# Patient Record
Sex: Male | Born: 1955 | Race: White | Hispanic: No | Marital: Married | Smoking: Never smoker
Health system: Southern US, Community
[De-identification: ages and names within clinical notes are randomized; demographics above are authoritative.]

## PROBLEM LIST (undated history)

## (undated) DIAGNOSIS — I10 Essential (primary) hypertension: Secondary | ICD-10-CM

## (undated) DIAGNOSIS — M1990 Unspecified osteoarthritis, unspecified site: Secondary | ICD-10-CM

## (undated) DIAGNOSIS — Z87828 Personal history of other (healed) physical injury and trauma: Secondary | ICD-10-CM

## (undated) HISTORY — DX: Personal history of other (healed) physical injury and trauma: Z87.828

## (undated) HISTORY — PX: JOINT REPLACEMENT: SHX530

## (undated) HISTORY — DX: Unspecified osteoarthritis, unspecified site: M19.90

## (undated) HISTORY — PX: BACK SURGERY: SHX140

## (undated) HISTORY — DX: Essential (primary) hypertension: I10

## (undated) HISTORY — PX: CATARACT EXTRACTION: SUR2

## (undated) HISTORY — PX: KNEE SURGERY: SHX244

## (undated) HISTORY — PX: HERNIA REPAIR: SHX51

---

## 1997-07-02 ENCOUNTER — Emergency Department (HOSPITAL_COMMUNITY): Admission: EM | Admit: 1997-07-02 | Discharge: 1997-07-02 | Payer: Self-pay | Admitting: Emergency Medicine

## 1998-01-04 ENCOUNTER — Encounter: Payer: Self-pay | Admitting: Emergency Medicine

## 1998-01-04 ENCOUNTER — Emergency Department (HOSPITAL_COMMUNITY): Admission: EM | Admit: 1998-01-04 | Discharge: 1998-01-04 | Payer: Self-pay | Admitting: Emergency Medicine

## 2003-02-07 HISTORY — PX: CARDIAC CATHETERIZATION: SHX172

## 2003-08-03 ENCOUNTER — Other Ambulatory Visit: Payer: Self-pay

## 2004-01-26 ENCOUNTER — Encounter: Payer: Self-pay | Admitting: Unknown Physician Specialty

## 2005-03-21 ENCOUNTER — Ambulatory Visit: Payer: Self-pay | Admitting: Unknown Physician Specialty

## 2006-01-23 ENCOUNTER — Emergency Department: Payer: Self-pay | Admitting: Emergency Medicine

## 2006-04-30 ENCOUNTER — Emergency Department: Payer: Self-pay | Admitting: Emergency Medicine

## 2006-05-09 ENCOUNTER — Inpatient Hospital Stay: Payer: Self-pay | Admitting: Internal Medicine

## 2006-05-10 ENCOUNTER — Inpatient Hospital Stay (HOSPITAL_COMMUNITY): Admission: AD | Admit: 2006-05-10 | Discharge: 2006-05-11 | Payer: Self-pay | Admitting: Cardiovascular Disease

## 2006-09-15 ENCOUNTER — Emergency Department: Payer: Self-pay | Admitting: Emergency Medicine

## 2006-09-17 ENCOUNTER — Inpatient Hospital Stay: Payer: Self-pay | Admitting: Internal Medicine

## 2007-02-04 ENCOUNTER — Emergency Department: Payer: Self-pay | Admitting: Emergency Medicine

## 2007-12-04 ENCOUNTER — Emergency Department: Payer: Self-pay | Admitting: Emergency Medicine

## 2010-01-25 ENCOUNTER — Emergency Department: Payer: Self-pay | Admitting: Unknown Physician Specialty

## 2010-03-29 ENCOUNTER — Emergency Department: Payer: Self-pay | Admitting: Emergency Medicine

## 2014-06-07 ENCOUNTER — Ambulatory Visit
Admission: EM | Admit: 2014-06-07 | Discharge: 2014-06-07 | Disposition: A | Discharge status: Home / Self Care | Payer: BLUE CROSS/BLUE SHIELD | Attending: Internal Medicine | Admitting: Internal Medicine

## 2014-06-07 DIAGNOSIS — R12 Heartburn: Secondary | ICD-10-CM

## 2014-06-07 DIAGNOSIS — R1010 Upper abdominal pain, unspecified: Secondary | ICD-10-CM

## 2014-06-07 DIAGNOSIS — K5900 Constipation, unspecified: Secondary | ICD-10-CM

## 2014-06-07 MED ORDER — OMEPRAZOLE 20 MG PO CPDR: DELAYED_RELEASE_CAPSULE | ORAL | Status: DC

## 2014-06-07 NOTE — ED Provider Notes (Signed)
CSN: [id number]     Arrival date & time 06/07/14  1133 History   First MD Initiated Contact with Patient 06/07/14 1336     Chief Complaint  Patient presents with  . Abdominal Pain   (Consider location/radiation/quality/duration/timing/severity/associated sxs/prior Treatment) Patient is a 59 y.o. male presenting with abdominal pain. The history is provided by the patient and the spouse.  Abdominal Pain Pain location:  LUQ, RUQ, epigastric and periumbilical Pain quality: aching, bloating and cramping   Pain radiates to:  Does not radiate Pain severity:  Moderate Onset quality:  Sudden Timing:  Intermittent Progression:  Unchanged Chronicity:  New Context: diet changes and laxative use   Context: not alcohol use, not awakening from sleep, not eating, not medication withdrawal, not previous surgeries, not recent illness, not recent sexual activity, not recent travel, not retching, not sick contacts, not suspicious food intake and not trauma   Relieved by:  Flatus and bowel activity Worsened by:  Nothing tried Ineffective treatments:  Antacids, bowel activity, flatus, eating, movement, palpation, not moving, position changes and urination Associated symptoms: chills, constipation, diarrhea and fatigue   Associated symptoms: no fever, no nausea and no vomiting    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  Started zantac  po OTC taking up to TID but not working. Noted chills, sweating 2 days ago, headache, umbilical/epigastric/sternal pain along with constipation-no BM x 1 week.  Tried diet modification with prunes no BM still so talked with pharmacist and got magnesium citrate took entire bottle yesterday because no BM 4 hours after 1/2 bottle consumption.  Bowels broke  loose, green diarrhea and brown chunks for next 6 hours.  Feeling better now but concerned still having occasional diarrhea, heartburn, belly pain, headaches, sweats and chills.  Two weeks ago had diarrhea/stomach virus exposed to neighbors with similar symptoms prior to onset and resolved after 6 hours.  Recently saw [facility] Apr 2016 for routine PE but was not having GI symptoms at that time.  No past medical history on file. Past Surgical History  Procedure Laterality Date  . Hernia repair    . Joint replacement     Family History  Problem Relation Age of Onset  . Cancer Mother   . Heart failure Father    History  Substance Use Topics  . Smoking status: Never Smoker   . Smokeless tobacco: Never Used  . Alcohol Use: 0.6 oz/week    1 Cans of beer per week    Review of Systems  Constitutional: Positive for chills, diaphoresis and fatigue. Negative for fever, activity change, appetite change and unexpected weight change.  HENT: Negative.   Eyes: Negative.   Respiratory: Negative.   Cardiovascular: Negative.   Gastrointestinal: Positive for abdominal pain, diarrhea and constipation. Negative for nausea, vomiting, blood in stool, abdominal distention, anal bleeding and rectal pain.  Genitourinary: Negative.   Skin: Negative.   Allergic/Immunologic: Negative.   Neurological: Negative.   Hematological: Negative.   Psychiatric/Behavioral: Negative.     Allergies  Codeine  Home Medications   Prior to Admission medications   Medication Sig Start Date End Date Taking? Authorizing Provider  acetaminophen (TYLENOL) 500 MG tablet Take 500 mg by mouth every 8 (eight) hours as needed.   Yes Historical Provider, MD  ranitidine (ZANTAC) 150 MG capsule Take 150 mg by mouth  2 (two) times daily.   Yes Historical Provider, MD  traMADol (ULTRAM) 50 MG tablet Take 50 mg by mouth every 6 (six) hours as needed.   Yes Historical Provider, MD  omeprazole (PRILOSEC) 20 MG capsule Take 1 tabs po BID  06/07/14   Jarold Songina A Darlen Gledhill, NP   BP 118/79 mmHg  Pulse 89  Temp(Src) 97 F (36.1 C) (Tympanic)  Ht 6' (1.829 m)  Wt 230 lb (104.327 kg)  BMI 31.19 kg/m2  SpO2 98% Physical Exam  Constitutional: He appears well-developed and well-nourished. No distress.  HENT:  Head: Normocephalic and atraumatic.  Right Ear: Hearing, tympanic membrane, external ear and ear canal normal.  Left Ear: Hearing, tympanic membrane, external ear and ear canal normal.  Nose: Nose normal.  Mouth/Throat: Uvula is midline and mucous membranes are normal. Posterior oropharyngeal edema and posterior oropharyngeal erythema present. No oropharyngeal exudate or tonsillar abscesses.  Cobblestoning posterior pharynx  Eyes: Conjunctivae and EOM are normal. Pupils are equal, round, and reactive to light.  Abdominal: Soft. Normal appearance, normal aorta and bowel sounds are normal. He exhibits no distension, no fluid wave, no ascites and no mass. There is no hepatosplenomegaly. There is tenderness in the right upper quadrant, epigastric area and left upper quadrant. There is no rigidity, no rebound, no guarding, no CVA tenderness, no tenderness at McBurney's point and negative Murphy's sign. No hernia. Hernia confirmed negative in the ventral area.  Skin: He is not diaphoretic.    ED Course  Procedures (including critical care time) Labs Review Labs Reviewed - No data to display Discussed with patient and spouse:  High-fiber diet Eat more high-fiber foods, which will help prevent constipation.  The best sources of fiber are whole-grain cereals, such as shredded wheat or cereals with bran.  Fresh fruit and raw or cooked vegetables, especially asparagus, cabbage, carrots, corn, and broccoli are other good sources of fiber.   . Fluids  Drink plenty of water.  This helps to soften bowel movements so they are easier to pass.   Exercise.  May require fiber supplement discussed miralax 1 capful po daily.  Increase fruits/fibers  and whole grains in diet to get 25gm fiber in diet over course of next month.  Attempt diet modification.  Patient given [employer] handout on constipation/dietary fiber.  Patient agreed with plan of care verbalized understanding of information/instructions and had no further questions at this time. P2:  increase fruits/fiber/whole grains and fluid intake I have recommended clear fluids and advanced to soft as tolerated  Avoid dairy, spicy, large portions meat, concentrated sugars and fried foods until diarrhea resolves.  Patient given exitcare handout on diarrhea.  Medications as directed.   Return to the clinic if  symptoms persist or worsen; I have alerted the patient to call if high fever, dehydration, marked weakness, fainting, increased abdominal pain, blood in stool or vomit.  Patient given work/school excuse note for 48 hours.  Patient and spouse verbalized agreement and understanding of treatment plan.   P2:  Hand washing and fitness The pathophysiology of reflux is discussed.  Exit[employer] handout on esophageal reflux given to patient. Anti-reflux measures such as raising the head of the bed, avoiding tight clothing or belts, avoiding eating late at night and not lying down shortly after mealtime and achieving weight loss were discussed. Avoid ASA, NSAID's, caffeine, peppermints, alcohol and tobacco. OTC tums/antacids are often very helpful for PRN use.  However, for chronic or daily symptoms, prescription strength H2 blockers or a trial of  PPI's should be used.  H2 blocker not effective will stop and start prilosec  po BID and patient to follow up with PCM in 2 weeks.   Patient should alert me or PCM if there are persistent symptoms, dysphagia, weight loss or GI bleeding (bright red or black). Follow up with clinic provider or PCM if symptoms persist despite therapy. Patient and spouse verbalized agreement and understanding of treatment plan and had no further questions at this time.    P2:  Diet,  Food avoidance that aggravate condition, and Fitness  Imaging Review No results found.   MDM   1. Constipation, unspecified constipation type   2. Heartburn   3. Pain localized to upper abdomen        Barbaraann Barthel, NP 06/07/14 1928

## 2014-06-07 NOTE — ED Notes (Signed)
Patient with stomach virus a few weeks ago. That passed then had constipation and abd pain. Used OTC laxative and that helped. Still feeling warn out and discomfort. Has history of heartburn, umbilical hernia and stomach ulcers

## 2014-06-07 NOTE — Discharge Instructions (Signed)
Abdominal Pain °Many things can cause abdominal pain. Usually, abdominal pain is not caused by a disease and will improve without treatment. It can often be observed and treated at home. Your health care provider will do a physical exam and possibly order blood tests and X-rays to help determine the seriousness of your pain. However, in many cases, more time must pass before a clear cause of the pain can be found. Before that point, your health care provider may not know if you need more testing or further treatment. °HOME CARE INSTRUCTIONS  °Monitor your abdominal pain for any changes. The following actions may help to alleviate any discomfort you are experiencing: °· Only take over-the-counter or prescription medicines as directed by your health care provider. °· Do not take laxatives unless directed to do so by your health care provider. °· Try a clear liquid diet (broth, tea, or water) as directed by your health care provider. Slowly move to a bland diet as tolerated. °SEEK MEDICAL CARE IF: °· You have unexplained abdominal pain. °· You have abdominal pain associated with nausea or diarrhea. °· You have pain when you urinate or have a bowel movement. °· You experience abdominal pain that wakes you in the night. °· You have abdominal pain that is worsened or improved by eating food. °· You have abdominal pain that is worsened with eating fatty foods. °· You have a fever. °SEEK IMMEDIATE MEDICAL CARE IF:  °· Your pain does not go away within 2 hours. °· You keep throwing up (vomiting). °· Your pain is felt only in portions of the abdomen, such as the right side or the left lower portion of the abdomen. °· You pass bloody or black tarry stools. °MAKE SURE YOU: °· Understand these instructions.   °· Will watch your condition.   °· Will get help right away if you are not doing well or get worse.   °Document Released: 11/02/2004 Document Revised: 01/28/2013 Document Reviewed: 10/02/2012 °ExitCare® Patient Information  ©2015 [employer]. This information is not intended to replace advice given to you by your health care provider. Make sure you discuss any questions you have with your health care provider. ° °Gastritis, Adult °Gastritis is soreness and swelling (inflammation) of the lining of the stomach. Gastritis can develop as a sudden onset (acute) or long-term (chronic) condition. If gastritis is not treated, it can lead to stomach bleeding and ulcers. °CAUSES  °Gastritis occurs when the stomach lining is weak or damaged. Digestive juices from the stomach then inflame the weakened stomach lining. The stomach lining may be weak or damaged due to viral or bacterial infections. One common bacterial infection is the Helicobacter pylori infection. Gastritis can also result from excessive alcohol consumption, taking certain medicines, or having too much acid in the stomach.  °SYMPTOMS  °In some cases, there are no symptoms. When symptoms are present, they may include: °· Pain or a burning sensation in the upper abdomen. °· Nausea. °· Vomiting. °· An uncomfortable feeling of fullness after eating. °DIAGNOSIS  °Your caregiver may suspect you have gastritis based on your symptoms and a physical exam. To determine the cause of your gastritis, your caregiver may perform the following: °· Blood or stool tests to check for the H pylori bacterium. °· Gastroscopy. A thin, flexible tube (endoscope) is passed down the esophagus and into the stomach. The endoscope has a light and camera on the end. Your caregiver uses the endoscope to view the inside of the stomach. °· Taking a tissue sample (biopsy)   from the stomach to examine under a microscope. °TREATMENT  °Depending on the cause of your gastritis, medicines may be prescribed. If you have a bacterial infection, such as an H pylori infection, antibiotics may be given. If your gastritis is caused by too much acid in the stomach, H2 blockers or antacids may be given. Your caregiver may  recommend that you stop taking aspirin, ibuprofen, or other nonsteroidal anti-inflammatory drugs (NSAIDs). °HOME CARE INSTRUCTIONS °· Only take over-the-counter or prescription medicines as directed by your caregiver. °· If you were given antibiotic medicines, take them as directed. Finish them even if you start to feel better. °· Drink enough fluids to keep your urine clear or pale yellow. °· Avoid foods and drinks that make your symptoms worse, such as: °¨ Caffeine or alcoholic drinks. °¨ Chocolate. °¨ Peppermint or mint flavorings. °¨ Garlic and onions. °¨ Spicy foods. °¨ Citrus fruits, such as oranges, lemons, or limes. °¨ Tomato-based foods such as sauce, chili, salsa, and pizza. °¨ Fried and fatty foods. °· Eat small, frequent meals instead of large meals. °SEEK IMMEDIATE MEDICAL CARE IF:  °· You have black or dark red stools. °· You vomit blood or material that looks like coffee grounds. °· You are unable to keep fluids down. °· Your abdominal pain gets worse. °· You have a fever. °· You do not feel better after 1 week. °· You have any other questions or concerns. °MAKE SURE YOU: °· Understand these instructions. °· Will watch your condition. °· Will get help right away if you are not doing well or get worse. °Document Released: 01/17/2001 Document Revised: 07/25/2011 Document Reviewed: 03/08/2011 °ExitCare® Patient Information ©2015 [employer]. This information is not intended to replace advice given to you by your health care provider. Make sure you discuss any questions you have with your health care provider. ° °

## 2014-08-06 ENCOUNTER — Ambulatory Visit: Payer: Self-pay | Admitting: Family Medicine

## 2014-09-17 ENCOUNTER — Ambulatory Visit (INDEPENDENT_AMBULATORY_CARE_PROVIDER_SITE_OTHER): Payer: 59 | Admitting: Family Medicine

## 2014-09-17 ENCOUNTER — Encounter: Payer: Self-pay | Admitting: Family Medicine

## 2014-09-17 VITALS — BP 125/79 | HR 93 | Temp 97.8°F | Ht 70.6 in | Wt 239.0 lb

## 2014-09-17 DIAGNOSIS — E7800 Pure hypercholesterolemia, unspecified: Secondary | ICD-10-CM

## 2014-09-17 DIAGNOSIS — R972 Elevated prostate specific antigen [PSA]: Secondary | ICD-10-CM | POA: Diagnosis not present

## 2014-09-17 DIAGNOSIS — M545 Low back pain, unspecified: Secondary | ICD-10-CM | POA: Insufficient documentation

## 2014-09-17 DIAGNOSIS — M25561 Pain in right knee: Secondary | ICD-10-CM | POA: Diagnosis not present

## 2014-09-17 DIAGNOSIS — K219 Gastro-esophageal reflux disease without esophagitis: Secondary | ICD-10-CM | POA: Diagnosis not present

## 2014-09-17 DIAGNOSIS — Z0000 Encounter for general adult medical examination without abnormal findings: Secondary | ICD-10-CM | POA: Diagnosis not present

## 2014-09-17 DIAGNOSIS — G8929 Other chronic pain: Secondary | ICD-10-CM

## 2014-09-17 LAB — MICROSCOPIC EXAMINATION

## 2014-09-17 LAB — URINALYSIS, ROUTINE W REFLEX MICROSCOPIC
Bilirubin, UA: NEGATIVE
Glucose, UA: NEGATIVE
Ketones, UA: NEGATIVE
Nitrite, UA: NEGATIVE
Protein, UA: NEGATIVE
RBC, UA: NEGATIVE
SPEC GRAV UA: 1.02 (ref 1.005–1.030)
Urobilinogen, Ur: 0.2 mg/dL (ref 0.2–1.0)
pH, UA: 5 (ref 5.0–7.5)

## 2014-09-17 MED ORDER — OMEPRAZOLE 20 MG PO CPDR: DELAYED_RELEASE_CAPSULE | ORAL | Status: DC

## 2014-09-17 MED ORDER — TRAMADOL HCL 50 MG PO TABS: 50.0000 mg | ORAL_TABLET | Freq: Four times a day (QID) | ORAL | Status: DC | PRN

## 2014-09-17 NOTE — Assessment & Plan Note (Signed)
Stable with occ tramadol use S/p 4 surgerys

## 2014-09-17 NOTE — Assessment & Plan Note (Signed)
The current medical regimen is effective;  continue present plan and medications.  

## 2014-09-17 NOTE — Progress Notes (Addendum)
BP 125/79 mmHg  Pulse 93  Temp(Src) 97.8 F (36.6 C)  Ht 5' 10.6" (1.793 m)  Wt 239 lb (108.41 kg)  BMI 33.72 kg/m2  SpO2 97%   Subjective:    Patient ID: Peter [name], male    DOB: 29-Jan-1956, 59 y.o.   MRN: [id number]  HPI: Peter [name] is a 59 y.o. male  Chief Complaint  Patient presents with  . Annual Exam   patient otherwise doing well and had some dental surgery which is recovering from doing well has been placed on omeprazole 20 mg twice a day which is really resolved his reflux and chest pain symptoms. Patient continues to use tramadol on a when necessary basis. This is a result of trauma of falling through a roof in 1979 resulting back surgery. Patient's also had 4 surgeries on his right knee is result of this injury. 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   Relevant past medical, surgical, family and social history reviewed and updated as indicated. Interim medical history since our last visit reviewed. Allergies and medications reviewed and updated.  Review of Systems  Constitutional: Negative.   HENT: Negative.   Eyes: Negative.   Respiratory: Negative.   Cardiovascular: Negative.   Endocrine: Negative.   Musculoskeletal: Negative.   Skin: Negative.   Allergic/Immunologic: Negative.   Neurological: Negative.   Hematological: Negative.   Psychiatric/Behavioral: Negative.     Per HPI unless specifically indicated above     Objective:    BP 125/79 mmHg  Pulse 93  Temp(Src) 97.8 F (36.6 C)  Ht 5' 10.6" (1.793 m)  Wt 239 lb (108.41 kg)  BMI 33.72 kg/m2  SpO2 97%  Wt Readings from Last 3 Encounters:  09/17/14 239 lb (108.41 kg)  06/01/14 236 lb (107.049 kg)  06/07/14 230 lb (104.327 kg)    Physical Exam  Constitutional: He is oriented to person, place, and time. He appears well-developed and well-nourished.  HENT:  Head: Normocephalic and atraumatic.  Right Ear: External ear  normal.  Left Ear: External ear normal.  Eyes: Conjunctivae and EOM are normal. Pupils are equal, round, and reactive to light.  Neck: Normal range of motion. Neck supple.  Cardiovascular: Normal rate, regular rhythm, normal heart sounds and intact distal pulses.   Pulmonary/Chest: Effort normal and breath sounds normal.  Abdominal: Soft. Bowel sounds are normal. There is no splenomegaly or hepatomegaly.  Genitourinary: Rectum normal, prostate normal and penis normal.  Musculoskeletal: Normal range of motion.  Neurological: He is alert and oriented to person, place, and time. He has normal reflexes.  Skin: No rash noted. No erythema.  Psychiatric: He has a normal mood and affect. His behavior is normal. Judgment and thought content normal.    No results found for this or any previous visit.    Assessment & Plan:   Problem List Items Addressed This Visit      Digestive   Acid reflux    The current medical regimen is effective;  continue present plan and medications.       Relevant Medications   omeprazole (PRILOSEC) 20 MG capsule     Other   Chronic low back pain    Stable on occ tramadol use S/p mult surgery      Relevant Medications   traMADol (ULTRAM) 50 MG tablet   Chronic pain of right knee    Stable with occ tramadol use S/p 4 surgerys  Other Visit Diagnoses    Routine general medical examination at a health care facility    -  Primary    Relevant Orders    CBC with Differential/Platelet    Comprehensive metabolic panel    PSA    Lipid Panel w/o Chol/HDL Ratio    TSH    Urinalysis, Routine w reflex microscopic (not at [facility])    PE (physical exam), annual          discussed diet exercise wt loss  Follow up plan: Return in about 1 year (around 09/17/2015) for Physical Exam.      Phone call Discussed elevated lipids markedly so need to do better with diet and exercise nutrition Will recheck fasting lipid panel 2 months. Patient's PSA also  elevated Will recheck PSA 2 months

## 2014-09-17 NOTE — Assessment & Plan Note (Signed)
Stable on occ tramadol use S/p mult surgery

## 2014-09-18 LAB — CBC WITH DIFFERENTIAL/PLATELET
Basophils Absolute: 0 10*3/uL (ref 0.0–0.2)
Basos: 1 %
EOS (ABSOLUTE): 0.5 10*3/uL — ABNORMAL HIGH (ref 0.0–0.4)
Eos: 8 %
Hematocrit: 43.2 % (ref 37.5–51.0)
Hemoglobin: 14.6 g/dL (ref 12.6–17.7)
Immature Grans (Abs): 0 10*3/uL (ref 0.0–0.1)
Immature Granulocytes: 0 %
Lymphocytes Absolute: 1.5 10*3/uL (ref 0.7–3.1)
Lymphs: 22 %
MCH: 30.8 pg (ref 26.6–33.0)
MCHC: 33.8 g/dL (ref 31.5–35.7)
MCV: 91 fL (ref 79–97)
Monocytes Absolute: 0.4 10*3/uL (ref 0.1–0.9)
Monocytes: 7 %
Neutrophils Absolute: 4.2 10*3/uL (ref 1.4–7.0)
Neutrophils: 62 %
Platelets: 362 10*3/uL (ref 150–379)
RBC: 4.74 x10E6/uL (ref 4.14–5.80)
RDW: 14.6 % (ref 12.3–15.4)
WBC: 6.6 10*3/uL (ref 3.4–10.8)

## 2014-09-18 LAB — LIPID PANEL W/O CHOL/HDL RATIO
Cholesterol, Total: 279 mg/dL — ABNORMAL HIGH (ref 100–199)
HDL: 37 mg/dL — ABNORMAL LOW (ref >39)
LDL Calculated: 165 mg/dL — ABNORMAL HIGH (ref 0–99)
Triglycerides: 387 mg/dL — ABNORMAL HIGH (ref 0–149)
VLDL Cholesterol Cal: 77 mg/dL — ABNORMAL HIGH (ref 5–40)

## 2014-09-18 LAB — TSH: TSH: 1.65 u[IU]/mL (ref 0.450–4.500)

## 2014-09-18 LAB — COMPREHENSIVE METABOLIC PANEL
ALT: 20 IU/L (ref 0–44)
AST: 18 IU/L (ref 0–40)
Albumin/Globulin Ratio: 1.7 (ref 1.1–2.5)
Albumin: 4.1 g/dL (ref 3.5–5.5)
Alkaline Phosphatase: 74 IU/L (ref 39–117)
BUN/Creatinine Ratio: 15 (ref 9–20)
BUN: 15 mg/dL (ref 6–24)
Bilirubin Total: 0.2 mg/dL (ref 0.0–1.2)
CO2: 21 mmol/L (ref 18–29)
Calcium: 10 mg/dL (ref 8.7–10.2)
Chloride: 99 mmol/L (ref 97–108)
Creatinine, Ser: 1 mg/dL (ref 0.76–1.27)
GFR calc Af Amer: 96 mL/min/{1.73_m2} (ref >59)
GFR calc non Af Amer: 83 mL/min/{1.73_m2} (ref >59)
Globulin, Total: 2.4 g/dL (ref 1.5–4.5)
Glucose: 94 mg/dL (ref 65–99)
Potassium: 4.5 mmol/L (ref 3.5–5.2)
Sodium: 138 mmol/L (ref 134–144)
Total Protein: 6.5 g/dL (ref 6.0–8.5)

## 2014-09-18 LAB — PSA: Prostate Specific Ag, Serum: 6 ng/mL — ABNORMAL HIGH (ref 0.0–4.0)

## 2014-09-21 NOTE — Addendum Note (Signed)
Addended byVonita Moss on: 09/21/2014 12:38 PM   Modules accepted: Orders, SmartSet

## 2014-12-09 ENCOUNTER — Ambulatory Visit (INDEPENDENT_AMBULATORY_CARE_PROVIDER_SITE_OTHER): Payer: Self-pay | Admitting: Family Medicine

## 2014-12-09 ENCOUNTER — Encounter: Payer: Self-pay | Admitting: Family Medicine

## 2014-12-09 ENCOUNTER — Ambulatory Visit: Payer: 59 | Admitting: Family Medicine

## 2014-12-09 VITALS — BP 123/76 | HR 84 | Temp 98.2°F | Wt 245.0 lb

## 2014-12-09 DIAGNOSIS — R972 Elevated prostate specific antigen [PSA]: Secondary | ICD-10-CM

## 2014-12-09 DIAGNOSIS — E7800 Pure hypercholesterolemia, unspecified: Secondary | ICD-10-CM

## 2014-12-09 DIAGNOSIS — G8929 Other chronic pain: Secondary | ICD-10-CM

## 2014-12-09 DIAGNOSIS — M25561 Pain in right knee: Secondary | ICD-10-CM

## 2014-12-09 MED ORDER — TRAMADOL HCL 50 MG PO TABS: 50.0000 mg | ORAL_TABLET | Freq: Four times a day (QID) | ORAL | Status: DC | PRN

## 2014-12-09 NOTE — Progress Notes (Signed)
BP 123/76 mmHg  Pulse 84  Temp(Src) 98.2 F (36.8 C)  Wt 245 lb (111.131 kg)  SpO2 96%   Subjective:    Patient ID: Peter [name], male    DOB: Aug 23, 1955, 59 y.o.   MRN: [id number]  HPI: Peter SalinasGeorge T Mcgeachy is a 59 y.o. male  Chief Complaint  Patient presents with  . Hyperlipidemia  . re check bloodwork   patient recheck has lost weight in spite of scales weight was done was still toed boots on  lost 1-2 pounds in last month doing much better with diet Patient's walking exercising a lot more. Not driving Not eating Road food Still drinking a lot of sugared drinks i.e. orange juice Patient eating a lot more healthier area did  Relevant past medical, surgical, family and social history reviewed and updated as indicated. Interim medical history since our last visit reviewed. Allergies and medications reviewed and updated.  Review of Systems  Per HPI unless specifically indicated above     Objective:    BP 123/76 mmHg  Pulse 84  Temp(Src) 98.2 F (36.8 C)  Wt 245 lb (111.131 kg)  SpO2 96%  Wt Readings from Last 3 Encounters:  12/09/14 245 lb (111.131 kg)  09/17/14 239 lb (108.41 kg)  06/01/14 236 lb (107.049 kg)    Physical Exam  Constitutional: He is oriented to person, place, and time. He appears well-developed and well-nourished. No distress.  HENT:  Head: Normocephalic and atraumatic.  Right Ear: Hearing normal.  Left Ear: Hearing normal.  Nose: Nose normal.  Eyes: Conjunctivae and lids are normal. Right eye exhibits no discharge. Left eye exhibits no discharge. No scleral icterus.  Pulmonary/Chest: Effort normal. No respiratory distress.  Musculoskeletal: Normal range of motion.  Neurological: He is alert and oriented to person, place, and time.  Skin: Skin is intact. No rash noted.  Psychiatric: He has a normal mood and affect. His speech is normal and behavior is normal. Judgment and thought content normal. Cognition and memory are normal.    Results  for orders placed or performed in visit on 09/17/14  Microscopic Examination  Result Value Ref Range   WBC, UA 0-5 0 -  5 /hpf   RBC, UA 0-2 0 -  2 /hpf   Epithelial Cells (non renal) 0-10 0 - 10 /hpf   Bacteria, UA Few None seen/Few  CBC with Differential/Platelet  Result Value Ref Range   WBC 6.6 3.4 - 10.8 x10E3/uL   RBC 4.74 4.14 - 5.80 x10E6/uL   Hemoglobin 14.6 12.6 - 17.7 g/dL   Hematocrit 40.943.2 81.137.5 - 51.0 %   MCV 91 79 - 97 fL   MCH 30.8 26.6 - 33.0 pg   MCHC 33.8 31.5 - 35.7 g/dL   RDW 91.414.6 78.212.3 - 95.615.4 %   Platelets 362 150 - 379 x10E3/uL   Neutrophils 62 %   Lymphs 22 %   Monocytes 7 %   Eos 8 %   Basos 1 %   Neutrophils Absolute 4.2 1.4 - 7.0 x10E3/uL   Lymphocytes Absolute 1.5 0.7 - 3.1 x10E3/uL   Monocytes Absolute 0.4 0.1 - 0.9 x10E3/uL   EOS (ABSOLUTE) 0.5 (H) 0.0 - 0.4 x10E3/uL   Basophils Absolute 0.0 0.0 - 0.2 x10E3/uL   Immature Granulocytes 0 %   Immature Grans (Abs) 0.0 0.0 - 0.1 x10E3/uL  Comprehensive metabolic panel  Result Value Ref Range   Glucose 94 65 - 99 mg/dL   BUN 15 6 - 24 mg/dL  Creatinine, Ser 1.00 0.76 - 1.27 mg/dL   GFR calc non Af Amer 83 >59 mL/min/1.73   GFR calc Af Amer 96 >59 mL/min/1.73   BUN/Creatinine Ratio 15 9 - 20   Sodium 138 134 - 144 mmol/L   Potassium 4.5 3.5 - 5.2 mmol/L   Chloride 99 97 - 108 mmol/L   CO2 21 18 - 29 mmol/L   Calcium 10.0 8.7 - 10.2 mg/dL   Total Protein 6.5 6.0 - 8.5 g/dL   Albumin 4.1 3.5 - 5.5 g/dL   Globulin, Total 2.4 1.5 - 4.5 g/dL   Albumin/Globulin Ratio 1.7 1.1 - 2.5   Bilirubin Total 0.2 0.0 - 1.2 mg/dL   Alkaline Phosphatase 74 39 - 117 IU/L   AST 18 0 - 40 IU/L   ALT 20 0 - 44 IU/L  PSA  Result Value Ref Range   Prostate Specific Ag, Serum 6.0 (H) 0.0 - 4.0 ng/mL  Lipid Panel w/o Chol/HDL Ratio  Result Value Ref Range   Cholesterol, Total 279 (H) 100 - 199 mg/dL   Triglycerides 161 (H) 0 - 149 mg/dL   HDL 37 (L) >09 mg/dL   VLDL Cholesterol Cal 77 (H) 5 - 40 mg/dL   LDL  Calculated 604 (H) 0 - 99 mg/dL  TSH  Result Value Ref Range   TSH 1.650 0.450 - 4.500 uIU/mL  Urinalysis, Routine w reflex microscopic (not at [facility])  Result Value Ref Range   Specific Gravity, UA 1.020 1.005 - 1.030   pH, UA 5.0 5.0 - 7.5   Color, UA Yellow Yellow   Appearance Ur Clear Clear   Leukocytes, UA Trace (A) Negative   Protein, UA Negative Negative/Trace   Glucose, UA Negative Negative   Ketones, UA Negative Negative   RBC, UA Negative Negative   Bilirubin, UA Negative Negative   Urobilinogen, Ur 0.2 0.2 - 1.0 mg/dL   Nitrite, UA Negative Negative   Microscopic Examination See below:       Assessment & Plan:   Problem List Items Addressed This Visit    None    Visit Diagnoses    Elevated PSA        Hypercholesteremia        Lab work pending with dietary changes will assess if still high in spite of dietary changes will need medication       patient needed to change his tramadol prescription to monthly due to financial concerns  Follow up plan: Return for pending labs.

## 2014-12-10 LAB — LIPID PANEL
CHOLESTEROL TOTAL: 247 mg/dL — AB (ref 100–199)
Chol/HDL Ratio: 7.1 ratio units — ABNORMAL HIGH (ref 0.0–5.0)
HDL: 35 mg/dL — ABNORMAL LOW (ref >39)
TRIGLYCERIDES: 489 mg/dL — AB (ref 0–149)

## 2014-12-10 LAB — PSA: Prostate Specific Ag, Serum: 7.3 ng/mL — ABNORMAL HIGH (ref 0.0–4.0)

## 2014-12-14 DIAGNOSIS — R972 Elevated prostate specific antigen [PSA]: Secondary | ICD-10-CM | POA: Insufficient documentation

## 2014-12-14 NOTE — Assessment & Plan Note (Signed)
Patient with PSA continuing to rise we'll need referral

## 2014-12-14 NOTE — Addendum Note (Signed)
Addended byVonita Moss: Raisa Ditto on: 12/14/2014 05:16 PM   Modules accepted: Orders, SmartSet

## 2014-12-21 ENCOUNTER — Encounter: Payer: Self-pay | Admitting: Urology

## 2014-12-21 ENCOUNTER — Ambulatory Visit: Payer: Self-pay | Admitting: Urology

## 2015-01-26 ENCOUNTER — Ambulatory Visit: Payer: Self-pay

## 2015-03-10 ENCOUNTER — Ambulatory Visit (INDEPENDENT_AMBULATORY_CARE_PROVIDER_SITE_OTHER): Payer: 59 | Admitting: Family Medicine

## 2015-03-10 ENCOUNTER — Encounter: Payer: Self-pay | Admitting: Family Medicine

## 2015-03-10 VITALS — BP 117/79 | HR 101 | Temp 98.0°F | Ht 71.0 in | Wt 243.0 lb

## 2015-03-10 DIAGNOSIS — G8929 Other chronic pain: Secondary | ICD-10-CM | POA: Diagnosis not present

## 2015-03-10 DIAGNOSIS — M25561 Pain in right knee: Secondary | ICD-10-CM | POA: Diagnosis not present

## 2015-03-10 DIAGNOSIS — K219 Gastro-esophageal reflux disease without esophagitis: Secondary | ICD-10-CM | POA: Diagnosis not present

## 2015-03-10 MED ORDER — TRAMADOL HCL 50 MG PO TABS: 50.0000 mg | ORAL_TABLET | Freq: Four times a day (QID) | ORAL | Status: DC | PRN

## 2015-03-10 NOTE — Assessment & Plan Note (Signed)
The current medical regimen is effective;  continue present plan and medications.  

## 2015-03-10 NOTE — Progress Notes (Signed)
BP 117/79 mmHg  Pulse 101  Temp(Src) 98 F (36.7 C)  Ht  (1.803 m)  Wt 243 lb (110.224 kg)  BMI 33.91 kg/m2  SpO2 95%   Subjective:    Patient ID: Peter [name], male    DOB: 05-Sep-1955, 60 y.o.   MRN: [id number]  HPI: Peter [name] is a 60 y.o. male  Chief Complaint  Patient presents with  . Medication Refill   patient doing okay knee pains well controlled except for the really cold days takes tramadol taken 8 tablets a day. Which is 2 tablets every 4-6 hours. Prescriptions lasting 3 months without problems. Is working without issues full-time Back is also helped with tramadol Reflux doing well with medication Prilosec controls without problems Also taking B12 without problems  Reflux well controlled with Prilosec  Relevant past medical, surgical, family and social history reviewed and updated as indicated. Interim medical history since our last visit reviewed. Allergies and medications reviewed and updated.  Review of Systems  Constitutional: Negative.   Respiratory: Negative.   Cardiovascular: Negative.     Per HPI unless specifically indicated above     Objective:    BP 117/79 mmHg  Pulse 101  Temp(Src) 98 F (36.7 C)  Ht  (1.803 m)  Wt 243 lb (110.224 kg)  BMI 33.91 kg/m2  SpO2 95%  Wt Readings from Last 3 Encounters:  03/10/15 243 lb (110.224 kg)  12/09/14 245 lb (111.131 kg)  09/17/14 239 lb (108.41 kg)    Physical Exam  Constitutional: He is oriented to person, place, and time. He appears well-developed and well-nourished. No distress.  HENT:  Head: Normocephalic and atraumatic.  Right Ear: Hearing normal.  Left Ear: Hearing normal.  Nose: Nose normal.  Eyes: Conjunctivae and lids are normal. Right eye exhibits no discharge. Left eye exhibits no discharge. No scleral icterus.  Cardiovascular: Normal rate, regular rhythm and normal heart sounds.   Pulmonary/Chest: Effort normal and breath sounds normal. No respiratory  distress.  Musculoskeletal: Normal range of motion.  Neurological: He is alert and oriented to person, place, and time.  Skin: Skin is intact. No rash noted.  Psychiatric: He has a normal mood and affect. His speech is normal and behavior is normal. Judgment and thought content normal. Cognition and memory are normal.    Results for orders placed or performed in visit on 12/09/14  PSA  Result Value Ref Range   Prostate Specific Ag, Serum 7.3 (H) 0.0 - 4.0 ng/mL  Lipid panel  Result Value Ref Range   Cholesterol, Total 247 (H) 100 - 199 mg/dL   Triglycerides 409 (H) 0 - 149 mg/dL   HDL 35 (L) >81 mg/dL   VLDL Cholesterol Cal Comment 5 - 40 mg/dL   LDL Calculated Comment 0 - 99 mg/dL   Chol/HDL Ratio 7.1 (H) 0.0 - 5.0 ratio units      Assessment & Plan:   Problem List Items Addressed This Visit      Digestive   Acid reflux - Primary    The current medical regimen is effective;  continue present plan and medications.         Other   Chronic pain of right knee    The current medical regimen is effective;  continue present plan and medications.       Relevant Medications   traMADol (ULTRAM) 50 MG tablet       Follow up plan: Return in about 3 months (around 06/07/2015) for  Medicine check refill tramadol.

## 2015-05-04 ENCOUNTER — Telehealth: Payer: Self-pay

## 2015-05-04 MED ORDER — OSELTAMIVIR PHOSPHATE 75 MG PO CAPS: 75.0000 mg | ORAL_CAPSULE | Freq: Every day | ORAL | Status: DC

## 2015-05-04 NOTE — Telephone Encounter (Signed)
Patient's wife exposed to Flu, he is coming in town tonight and wants to know if he should get Rx for Tamiflu before he heads back out on the road  [facility]

## 2015-06-01 ENCOUNTER — Encounter: Payer: Self-pay | Admitting: Family Medicine

## 2015-06-01 ENCOUNTER — Ambulatory Visit (INDEPENDENT_AMBULATORY_CARE_PROVIDER_SITE_OTHER): Payer: 59 | Admitting: Family Medicine

## 2015-06-01 VITALS — BP 118/81 | HR 92 | Temp 98.0°F | Ht 71.0 in | Wt 246.0 lb

## 2015-06-01 DIAGNOSIS — M545 Low back pain: Secondary | ICD-10-CM

## 2015-06-01 DIAGNOSIS — M25561 Pain in right knee: Secondary | ICD-10-CM | POA: Diagnosis not present

## 2015-06-01 DIAGNOSIS — G8929 Other chronic pain: Secondary | ICD-10-CM | POA: Diagnosis not present

## 2015-06-01 MED ORDER — TRAMADOL HCL 50 MG PO TABS: 50.0000 mg | ORAL_TABLET | Freq: Four times a day (QID) | ORAL | Status: DC | PRN

## 2015-06-01 NOTE — Progress Notes (Signed)
   BP 118/81 mmHg  Pulse 92  Temp(Src) 98 F (36.7 C)  Ht 5\' 11"  (1.803 m)  Wt 246 lb (111.585 kg)  BMI 34.33 kg/m2  SpO2 99%   Subjective:    Patient ID: Peter [name], male    DOB: Feb 07, 1955, 60 y.o.   MRN: [id number]  HPI: Peter SalinasGeorge T Droessler is a 60 y.o. male  Chief Complaint  Patient presents with  . medication check  Patient follow-up pain control is adequate with current tramadol dosing for both back and knee. Reflux doing well with twice a day Prilosec  Relevant past medical, surgical, family and social history reviewed and updated as indicated. Interim medical history since our last visit reviewed. Allergies and medications reviewed and updated.  Review of Systems  Constitutional: Negative.   Respiratory: Negative.   Cardiovascular: Negative.     Per HPI unless specifically indicated above     Objective:    BP 118/81 mmHg  Pulse 92  Temp(Src) 98 F (36.7 C)  Ht 5\' 11"  (1.803 m)  Wt 246 lb (111.585 kg)  BMI 34.33 kg/m2  SpO2 99%  Wt Readings from Last 3 Encounters:  06/01/15 246 lb (111.585 kg)  03/10/15 243 lb (110.224 kg)  12/09/14 245 lb (111.131 kg)    Physical Exam  Constitutional: He is oriented to person, place, and time. He appears well-developed and well-nourished. No distress.  HENT:  Head: Normocephalic and atraumatic.  Right Ear: Hearing normal.  Left Ear: Hearing normal.  Nose: Nose normal.  Eyes: Conjunctivae and lids are normal. Right eye exhibits no discharge. Left eye exhibits no discharge. No scleral icterus.  Cardiovascular: Normal rate, regular rhythm and normal heart sounds.   Pulmonary/Chest: Effort normal and breath sounds normal. No respiratory distress.  Musculoskeletal: Normal range of motion.  Joints stable  Neurological: He is alert and oriented to person, place, and time.  Skin: Skin is intact. No rash noted.  Psychiatric: He has a normal mood and affect. His speech is normal and behavior is normal. Judgment and  thought content normal. Cognition and memory are normal.    Results for orders placed or performed in visit on 12/09/14  PSA  Result Value Ref Range   Prostate Specific Ag, Serum 7.3 (H) 0.0 - 4.0 ng/mL  Lipid panel  Result Value Ref Range   Cholesterol, Total 247 (H) 100 - 199 mg/dL   Triglycerides 409489 (H) 0 - 149 mg/dL   HDL 35 (L) >81>39 mg/dL   VLDL Cholesterol Cal Comment 5 - 40 mg/dL   LDL Calculated Comment 0 - 99 mg/dL   Chol/HDL Ratio 7.1 (H) 0.0 - 5.0 ratio units      Assessment & Plan:   Problem List Items Addressed This Visit      Other   Chronic low back pain    Pain controled with meds and doing OK      Relevant Medications   traMADol (ULTRAM) 50 MG tablet   Chronic pain of right knee - Primary    Tramadol doing ok and controling      Relevant Medications   traMADol (ULTRAM) 50 MG tablet       Follow up plan: Return in about 3 months (around 08/31/2015), or if symptoms worsen or fail to improve, for Physical Exam.

## 2015-06-01 NOTE — Assessment & Plan Note (Signed)
Tramadol doing ok and controling

## 2015-06-01 NOTE — Assessment & Plan Note (Signed)
Pain controled with meds and doing OK

## 2015-06-27 ENCOUNTER — Encounter: Payer: Self-pay | Admitting: *Deleted

## 2015-06-27 ENCOUNTER — Ambulatory Visit: Payer: Managed Care, Other (non HMO)

## 2015-06-27 ENCOUNTER — Ambulatory Visit (INDEPENDENT_AMBULATORY_CARE_PROVIDER_SITE_OTHER): Payer: Managed Care, Other (non HMO)

## 2015-06-27 ENCOUNTER — Ambulatory Visit
Admission: EM | Admit: 2015-06-27 | Discharge: 2015-06-27 | Disposition: A | Discharge status: Home / Self Care | Payer: Managed Care, Other (non HMO) | Attending: Family Medicine | Admitting: Family Medicine

## 2015-06-27 DIAGNOSIS — T148 Other injury of unspecified body region: Secondary | ICD-10-CM

## 2015-06-27 DIAGNOSIS — T148XXA Other injury of unspecified body region, initial encounter: Secondary | ICD-10-CM

## 2015-06-27 MED ORDER — AMOXICILLIN-POT CLAVULANATE 875-125 MG PO TABS: 1.0000 | ORAL_TABLET | Freq: Two times a day (BID) | ORAL | Status: DC

## 2015-06-27 MED ORDER — MUPIROCIN 2 % EX OINT: 1.0000 "application " | TOPICAL_OINTMENT | Freq: Three times a day (TID) | CUTANEOUS | Status: DC

## 2015-06-27 MED ORDER — TETANUS-DIPHTH-ACELL PERTUSSIS 5-2.5-18.5 LF-MCG/0.5 IM SUSP
0.5000 mL | Freq: Once | INTRAMUSCULAR | Status: AC
  Administered 2015-06-27: 0.5 mL via INTRAMUSCULAR
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
## 2015-06-27 NOTE — ED Notes (Signed)
Pt states that he was working with wood and was hit in the right knee with a rusty nail.

## 2015-06-27 NOTE — Discharge Instructions (Signed)
Antibiotics as directed Warm compresses/heat pad as desired for comfort Ibuprofen  / tylenol as needed Report to primary care if any indication of infection   Puncture Wound A puncture wound is an injury that is caused by a sharp, thin object that goes through (penetrates) your skin. Usually, a puncture wound does not leave a large opening in your skin, so it may not bleed a lot. However, when you get a puncture wound, dirt or other materials (foreign bodies) can be forced into your wound and break off inside. This increases the chance of infection, such as tetanus. CAUSES Puncture wounds are caused by any sharp, thin object that goes through your skin, such as:  Animal teeth, as with an animal bite.  Sharp, pointed objects, such as nails, splinters of glass, fishhooks, and needles. SYMPTOMS Symptoms of a puncture wound include:  Pain.  Bleeding.  Swelling.  Bruising.  Fluid leaking from the wound.  Numbness, tingling, or loss of function. DIAGNOSIS This condition is diagnosed with a medical history and physical exam. Your wound will be checked to see if it contains any foreign bodies. You may also have X-rays or other imaging tests. TREATMENT Treatment for a puncture wound depends on how serious the wound is. It also depends on whether the wound contains any foreign bodies. Treatment for all types of puncture wounds usually starts with:  Controlling the bleeding.  Washing out the wound with a germ-free (sterile) salt-water solution.  Checking the wound for foreign bodies. Treatment may also include:  Having the wound opened surgically to remove a foreign object.  Closing the wound with stitches (sutures) if it continues to bleed.  Covering the wound with antibiotic ointments and a bandage (dressing).  Receiving a tetanus shot.  Receiving a rabies vaccine. HOME CARE INSTRUCTIONS Medicines  Take or apply over-the-counter and prescription medicines only as told by  your health care provider.  If you were prescribed an antibiotic, take or apply it as told by your health care provider. Do not stop using the antibiotic even if your condition improves. Wound Care  There are many ways to close and cover a wound. For example, a wound can be covered with sutures, skin glue, or adhesive strips. Follow instructions from your health care provider about:  How to take care of your wound.  When and how you should change your dressing.  When you should remove your dressing.  Removing whatever was used to close your wound.  Keep the dressing dry as told by your health care provider. Do not take baths, swim, use a hot tub, or do anything that would put your wound underwater until your health care provider approves.  Clean the wound as told by your health care provider.  Do not scratch or pick at the wound.  Check your wound every day for signs of infection. Watch for:  Redness, swelling, or pain.  Fluid, blood, or pus. General Instructions  Raise (elevate) the injured area above the level of your heart while you are sitting or lying down.  If your puncture wound is in your foot, ask your health care provider if you need to avoid putting weight on your foot and for how long.  Keep all follow-up visits as told by your health care provider. This is important. SEEK MEDICAL CARE IF:  You received a tetanus shot and you have swelling, severe pain, redness, or bleeding at the injection site.  You have a fever.  Your sutures come out.  You  notice a bad smell coming from your wound or your dressing.  You notice something coming out of your wound, such as wood or glass.  Your pain is not controlled with medicine.  You have increased redness, swelling, or pain at the site of your wound.  You have fluid, blood, or pus coming from your wound.  You notice a change in the color of your skin near your wound.  You need to change the dressing frequently due  to fluid, blood, or pus draining from your wound.  You develop a new rash.  You develop numbness around your wound. SEEK IMMEDIATE MEDICAL CARE IF:  You develop severe swelling around your wound.  Your pain suddenly increases and is severe.  You develop painful skin lumps.  You have a red streak going away from your wound.  The wound is on your hand or foot and you cannot properly move a finger or toe.  The wound is on your hand or foot and you notice that your fingers or toes look pale or bluish.   This information is not intended to replace advice given to you by your health care provider. Make sure you discuss any questions you have with your health care provider.   Document Released: 11/02/2004 Document Revised: 10/14/2014 Document Reviewed: 03/18/2014 Elsevier Interactive Patient Education [employer]

## 2015-09-07 ENCOUNTER — Encounter: Payer: Self-pay | Admitting: Family Medicine

## 2015-09-07 ENCOUNTER — Ambulatory Visit (INDEPENDENT_AMBULATORY_CARE_PROVIDER_SITE_OTHER): Payer: 59 | Admitting: Family Medicine

## 2015-09-07 DIAGNOSIS — G8929 Other chronic pain: Secondary | ICD-10-CM | POA: Diagnosis not present

## 2015-09-07 DIAGNOSIS — M25561 Pain in right knee: Secondary | ICD-10-CM

## 2015-09-07 DIAGNOSIS — K219 Gastro-esophageal reflux disease without esophagitis: Secondary | ICD-10-CM

## 2015-09-07 MED ORDER — OMEPRAZOLE 20 MG PO CPDR: DELAYED_RELEASE_CAPSULE | ORAL | 12 refills | Status: DC

## 2015-09-07 MED ORDER — TRAMADOL HCL 50 MG PO TABS: 50.0000 mg | ORAL_TABLET | Freq: Four times a day (QID) | ORAL | 2 refills | Status: DC | PRN
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
## 2015-10-01 ENCOUNTER — Other Ambulatory Visit: Payer: Self-pay | Admitting: Family Medicine

## 2015-10-01 DIAGNOSIS — K219 Gastro-esophageal reflux disease without esophagitis: Secondary | ICD-10-CM

## 2015-10-01 NOTE — Telephone Encounter (Signed)
Called pharmacy because RX has already been sent in. Please refuse rx as it has already been handled.

## 2015-12-15 ENCOUNTER — Encounter (INDEPENDENT_AMBULATORY_CARE_PROVIDER_SITE_OTHER): Payer: Self-pay

## 2015-12-27 ENCOUNTER — Encounter: Payer: Self-pay | Admitting: Family Medicine

## 2015-12-27 ENCOUNTER — Ambulatory Visit (INDEPENDENT_AMBULATORY_CARE_PROVIDER_SITE_OTHER): Payer: 59 | Admitting: Family Medicine

## 2015-12-27 VITALS — BP 128/88 | HR 82 | Temp 98.2°F | Wt 240.0 lb

## 2015-12-27 DIAGNOSIS — R972 Elevated prostate specific antigen [PSA]: Secondary | ICD-10-CM | POA: Diagnosis not present

## 2015-12-27 DIAGNOSIS — Z0000 Encounter for general adult medical examination without abnormal findings: Secondary | ICD-10-CM

## 2015-12-27 DIAGNOSIS — E7800 Pure hypercholesterolemia, unspecified: Secondary | ICD-10-CM | POA: Diagnosis not present

## 2015-12-27 DIAGNOSIS — M25561 Pain in right knee: Secondary | ICD-10-CM | POA: Diagnosis not present

## 2015-12-27 DIAGNOSIS — G8929 Other chronic pain: Secondary | ICD-10-CM

## 2015-12-27 LAB — URINALYSIS, ROUTINE W REFLEX MICROSCOPIC
Bilirubin, UA: NEGATIVE
Glucose, UA: NEGATIVE
Ketones, UA: NEGATIVE
LEUKOCYTES UA: NEGATIVE
NITRITE UA: NEGATIVE
PH UA: 5.5 (ref 5.0–7.5)
Protein, UA: NEGATIVE
RBC, UA: NEGATIVE
Specific Gravity, UA: 1.02 (ref 1.005–1.030)
Urobilinogen, Ur: 0.2 mg/dL (ref 0.2–1.0)

## 2015-12-27 MED ORDER — MELOXICAM 15 MG PO TABS: 15.0000 mg | ORAL_TABLET | Freq: Every day | ORAL | 3 refills | Status: DC

## 2015-12-27 MED ORDER — TRAMADOL HCL 50 MG PO TABS: 50.0000 mg | ORAL_TABLET | Freq: Four times a day (QID) | ORAL | 5 refills | Status: DC | PRN

## 2015-12-27 NOTE — Assessment & Plan Note (Signed)
Cause chronic ongoing pain needing chronic pain medicines will refer back to orthopedics to have knee reevaluated

## 2015-12-27 NOTE — Progress Notes (Signed)
BP 128/88   Pulse 82   Temp 98.2 F (36.8 C)   Wt 240 lb (108.9 kg)   SpO2 99%   BMI 32.55 kg/m    Subjective:    Patient ID: Peter Peter [name], Peter [name]    DOB: 1955-07-10, 60 y.o.   MRN: [id number]  HPI: Peter Peter [name] is a 60 y.o. Peter [name]  Chief Complaint  Patient presents with  . Annual Exam    he can't do the colonoscopy right now, but is willing to do the cologuard   Patient all in all doing well no issues with blood pressure but is having issues with chronic right knee pain. Spent ongoing for years is manageable with tramadol takes 240 a month has otherwise been doing well. Is also interested in taking meloxicam see if that will help. Hasn't been to orthopedics for some time and is interested in knee further evaluation.  Relevant past medical, surgical, family and social history reviewed and updated as indicated. Interim medical history since our last visit reviewed. Allergies and medications reviewed and updated.  Review of Systems  Constitutional: Negative.   HENT: Negative.   Eyes: Negative.   Respiratory: Negative.   Cardiovascular: Negative.   Gastrointestinal: Negative.   Endocrine: Negative.   Genitourinary: Negative.   Musculoskeletal: Negative.   Skin: Negative.   Allergic/Immunologic: Negative.   Neurological: Negative.   Hematological: Negative.   Psychiatric/Behavioral: Negative.     Per HPI unless specifically indicated above     Objective:    BP 128/88   Pulse 82   Temp 98.2 F (36.8 C)   Wt 240 lb (108.9 kg)   SpO2 99%   BMI 32.55 kg/m   Wt Readings from Last 3 Encounters:  12/27/15 240 lb (108.9 kg)  09/07/15 242 lb (109.8 kg)  06/27/15 240 lb (108.9 kg)    Physical Exam  Constitutional: He is oriented to person, place, and time. He appears well-developed and well-nourished.  HENT:  Head: Normocephalic and atraumatic.  Right Ear: External ear normal.  Left Ear: External ear normal.  Eyes: Conjunctivae and EOM are normal. Pupils  are equal, round, and reactive to light.  Neck: Normal range of motion. Neck supple.  Cardiovascular: Normal rate, regular rhythm, normal heart sounds and intact distal pulses.   Pulmonary/Chest: Effort normal and breath sounds normal.  Abdominal: Soft. Bowel sounds are normal. There is no splenomegaly or hepatomegaly.  Genitourinary: Rectum normal, prostate normal and penis normal.  Musculoskeletal: Normal range of motion.  Neurological: He is alert and oriented to person, place, and time. He has normal reflexes.  Skin: No rash noted. No erythema.  Psychiatric: He has a normal mood and affect. His behavior is normal. Judgment and thought content normal.    Results for orders placed or performed in visit on 12/09/14  PSA  Result Value Ref Range   Prostate Specific Ag, Serum 7.3 (H) 0.0 - 4.0 ng/mL  Lipid panel  Result Value Ref Range   Cholesterol, Total 247 (H) 100 - 199 mg/dL   Triglycerides 841489 (H) 0 - 149 mg/dL   HDL 35 (L) >32>39 mg/dL   VLDL Cholesterol Cal Comment 5 - 40 mg/dL   LDL Calculated Comment 0 - 99 mg/dL   Chol/HDL Ratio 7.1 (H) 0.0 - 5.0 ratio units      Assessment & Plan:   Problem List Items Addressed This Visit      Other   Chronic pain of right knee    Cause chronic  ongoing pain needing chronic pain medicines will refer back to orthopedics to have knee reevaluated      Relevant Medications   meloxicam (MOBIC) 15 MG tablet   traMADol (ULTRAM) 50 MG tablet   Other Relevant Orders   Ambulatory referral to Orthopedic Surgery   Elevated PSA - Primary   Relevant Orders   PSA    Other Visit Diagnoses    Hypercholesteremia       Relevant Orders   Lipid Profile   Routine general medical examination at a health care facility       Relevant Orders   CBC with Differential/Platelet   Comprehensive metabolic panel   Lipid Profile   PSA   TSH   Urinalysis, Routine w reflex microscopic (not at [facility])       Follow up plan: Return in about 6 months (around  06/25/2016) for med check.

## 2015-12-28 ENCOUNTER — Telehealth: Payer: Self-pay | Admitting: Family Medicine

## 2015-12-28 DIAGNOSIS — E7800 Pure hypercholesterolemia, unspecified: Secondary | ICD-10-CM | POA: Insufficient documentation

## 2015-12-28 LAB — CBC WITH DIFFERENTIAL/PLATELET
Basophils Absolute: 0 10*3/uL (ref 0.0–0.2)
Basos: 0 %
EOS (ABSOLUTE): 0.3 10*3/uL (ref 0.0–0.4)
EOS: 4 %
HEMATOCRIT: 40.7 % (ref 37.5–51.0)
Hemoglobin: 13.9 g/dL (ref 12.6–17.7)
Immature Grans (Abs): 0 10*3/uL (ref 0.0–0.1)
Immature Granulocytes: 0 %
LYMPHS ABS: 1.7 10*3/uL (ref 0.7–3.1)
Lymphs: 24 %
MCH: 31.2 pg (ref 26.6–33.0)
MCHC: 34.2 g/dL (ref 31.5–35.7)
MCV: 91 fL (ref 79–97)
MONOS ABS: 0.7 10*3/uL (ref 0.1–0.9)
Monocytes: 10 %
NEUTROS ABS: 4.2 10*3/uL (ref 1.4–7.0)
Neutrophils: 62 %
Platelets: 413 10*3/uL — ABNORMAL HIGH (ref 150–379)
RBC: 4.46 x10E6/uL (ref 4.14–5.80)
RDW: 13.9 % (ref 12.3–15.4)
WBC: 6.9 10*3/uL (ref 3.4–10.8)

## 2015-12-28 LAB — PSA: Prostate Specific Ag, Serum: 7.2 ng/mL — ABNORMAL HIGH (ref 0.0–4.0)

## 2015-12-28 LAB — COMPREHENSIVE METABOLIC PANEL
A/G RATIO: 1.8 (ref 1.2–2.2)
ALK PHOS: 77 IU/L (ref 39–117)
ALT: 17 IU/L (ref 0–44)
AST: 20 IU/L (ref 0–40)
Albumin: 4.6 g/dL (ref 3.5–5.5)
BILIRUBIN TOTAL: 0.3 mg/dL (ref 0.0–1.2)
BUN/Creatinine Ratio: 17 (ref 9–20)
BUN: 21 mg/dL (ref 6–24)
CHLORIDE: 99 mmol/L (ref 96–106)
CO2: 23 mmol/L (ref 18–29)
CREATININE: 1.27 mg/dL (ref 0.76–1.27)
Calcium: 9.5 mg/dL (ref 8.7–10.2)
GFR calc Af Amer: 72 mL/min/{1.73_m2} (ref >59)
GFR calc non Af Amer: 62 mL/min/{1.73_m2} (ref >59)
GLOBULIN, TOTAL: 2.5 g/dL (ref 1.5–4.5)
Glucose: 87 mg/dL (ref 65–99)
POTASSIUM: 4.2 mmol/L (ref 3.5–5.2)
SODIUM: 139 mmol/L (ref 134–144)
Total Protein: 7.1 g/dL (ref 6.0–8.5)

## 2015-12-28 LAB — LIPID PANEL
CHOLESTEROL TOTAL: 256 mg/dL — AB (ref 100–199)
Chol/HDL Ratio: 5.3 ratio units — ABNORMAL HIGH (ref 0.0–5.0)
HDL: 48 mg/dL (ref >39)
LDL Calculated: 172 mg/dL — ABNORMAL HIGH (ref 0–99)
TRIGLYCERIDES: 180 mg/dL — AB (ref 0–149)
VLDL Cholesterol Cal: 36 mg/dL (ref 5–40)

## 2015-12-28 LAB — TSH: TSH: 1.29 u[IU]/mL (ref 0.450–4.500)

## 2015-12-28 MED ORDER — ATORVASTATIN CALCIUM 40 MG PO TABS: 40.0000 mg | ORAL_TABLET | Freq: Every day | ORAL | 4 refills | Status: DC

## 2015-12-28 NOTE — Telephone Encounter (Signed)
Phone call Discussed with patient markedly elevated cholesterol will start medication reviewed medication risk and benefits. Patient will make an appointment in 2-3 months recheck lipid panel, ALT, AST.

## 2016-04-18 ENCOUNTER — Other Ambulatory Visit: Payer: Self-pay | Admitting: Family Medicine

## 2016-04-18 DIAGNOSIS — G8929 Other chronic pain: Secondary | ICD-10-CM

## 2016-04-18 DIAGNOSIS — M25561 Pain in right knee: Principal | ICD-10-CM

## 2016-04-18 NOTE — Telephone Encounter (Signed)
Routing to provider  

## 2016-05-07 ENCOUNTER — Encounter: Payer: Self-pay | Admitting: Emergency Medicine

## 2016-05-07 ENCOUNTER — Ambulatory Visit
Admission: EM | Admit: 2016-05-07 | Discharge: 2016-05-07 | Disposition: A | Discharge status: Home / Self Care | Payer: BLUE CROSS/BLUE SHIELD | Attending: Internal Medicine | Admitting: Internal Medicine

## 2016-05-07 DIAGNOSIS — J0110 Acute frontal sinusitis, unspecified: Secondary | ICD-10-CM

## 2016-05-07 MED ORDER — AMOXICILLIN 875 MG PO TABS: 875.0000 mg | ORAL_TABLET | Freq: Two times a day (BID) | ORAL | 0 refills | Status: AC

## 2016-05-07 NOTE — ED Triage Notes (Signed)
Patient c/o cough, chest congestion, and sinus pain and congestion and left ear pain that started around Thursday.
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
## 2016-05-21 ENCOUNTER — Emergency Department
Admission: EM | Admit: 2016-05-21 | Discharge: 2016-05-21 | Disposition: A | Discharge status: Home / Self Care | Payer: BLUE CROSS/BLUE SHIELD | Attending: Emergency Medicine | Admitting: Emergency Medicine

## 2016-05-21 DIAGNOSIS — Y999 Unspecified external cause status: Secondary | ICD-10-CM | POA: Diagnosis not present

## 2016-05-21 DIAGNOSIS — S61217A Laceration without foreign body of left little finger without damage to nail, initial encounter: Secondary | ICD-10-CM | POA: Diagnosis not present

## 2016-05-21 DIAGNOSIS — S61215A Laceration without foreign body of left ring finger without damage to nail, initial encounter: Secondary | ICD-10-CM | POA: Insufficient documentation

## 2016-05-21 DIAGNOSIS — Y929 Unspecified place or not applicable: Secondary | ICD-10-CM | POA: Insufficient documentation

## 2016-05-21 DIAGNOSIS — I10 Essential (primary) hypertension: Secondary | ICD-10-CM | POA: Diagnosis not present

## 2016-05-21 DIAGNOSIS — Z7982 Long term (current) use of aspirin: Secondary | ICD-10-CM | POA: Insufficient documentation

## 2016-05-21 DIAGNOSIS — Y939 Activity, unspecified: Secondary | ICD-10-CM | POA: Insufficient documentation

## 2016-05-21 DIAGNOSIS — Z79899 Other long term (current) drug therapy: Secondary | ICD-10-CM | POA: Diagnosis not present

## 2016-05-21 DIAGNOSIS — W208XXA Other cause of strike by thrown, projected or falling object, initial encounter: Secondary | ICD-10-CM | POA: Insufficient documentation

## 2016-05-21 DIAGNOSIS — S61213A Laceration without foreign body of left middle finger without damage to nail, initial encounter: Secondary | ICD-10-CM | POA: Diagnosis not present

## 2016-05-21 MED ORDER — LIDOCAINE HCL (PF) 1 % IJ SOLN
10.0000 mL | Freq: Once | INTRAMUSCULAR | Status: AC
  Administered 2016-05-21: 10 mL
  Filled 2016-05-21: qty 10

## 2016-05-21 NOTE — ED Triage Notes (Signed)
Pt states that the attic fan fell onto his left hand, pt has a lac to his left 3rd, 4th, and 5th fingers
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
## 2016-05-23 ENCOUNTER — Other Ambulatory Visit: Payer: Self-pay | Admitting: Family Medicine

## 2016-05-23 DIAGNOSIS — M25561 Pain in right knee: Principal | ICD-10-CM

## 2016-05-23 DIAGNOSIS — G8929 Other chronic pain: Secondary | ICD-10-CM

## 2016-05-24 ENCOUNTER — Telehealth: Payer: Self-pay | Admitting: Family Medicine

## 2016-05-24 NOTE — Telephone Encounter (Signed)
Your patient 

## 2016-05-24 NOTE — Telephone Encounter (Signed)
Patient's tramadol was approved.

## 2016-05-24 NOTE — Telephone Encounter (Signed)
Patient called to see if we had received a PA request for his Tramadol as per the patient Peter [name] at [employer] said he would need one in order to get his Tramadol.  He said he had been on Tramadol for years  Thanks

## 2016-05-29 ENCOUNTER — Encounter: Payer: Self-pay | Admitting: Family Medicine

## 2016-05-29 ENCOUNTER — Ambulatory Visit (INDEPENDENT_AMBULATORY_CARE_PROVIDER_SITE_OTHER): Payer: BLUE CROSS/BLUE SHIELD | Admitting: Family Medicine

## 2016-05-29 VITALS — BP 149/87 | HR 92 | Temp 97.8°F | Ht 72.0 in | Wt 232.2 lb

## 2016-05-29 DIAGNOSIS — S61215D Laceration without foreign body of left ring finger without damage to nail, subsequent encounter: Secondary | ICD-10-CM

## 2016-05-29 NOTE — Patient Instructions (Signed)
Follow up as needed

## 2016-05-29 NOTE — Progress Notes (Signed)
   BP (!) 149/87 (BP Location: Left Arm, Patient Position: Sitting, Cuff Size: Normal)   Pulse 92   Temp 97.8 F (36.6 C)   Ht 6' (1.829 m)   Wt 232 lb 3.2 oz (105.3 kg)   SpO2 99%   BMI 31.49 kg/m    Subjective:    Patient ID: Peter [name], male    DOB: [birth year], 61 y.o.   MRN: [id number]  HPI: Peter [name] is a 61 y.o. male  Chief Complaint  Patient presents with  . Suture / Staple Removal   Patient presents for suture removal on left 4th digit placed in hospital 4/15. Doing well, pain under good control. No thick drainage, erythema, warmth, fevers, chills. Other 2 lacs on surrounding digits also healing well. Pt refused sutures on these.   Relevant past medical, surgical, family and social history reviewed and updated as indicated. Interim medical history since our last visit reviewed. Allergies and medications reviewed and updated.  Review of Systems  Constitutional: Negative.   Eyes: Negative.   Respiratory: Negative.   Cardiovascular: Negative.   Musculoskeletal: Negative.   Skin: Positive for wound.  Neurological: Negative.   Psychiatric/Behavioral: Negative.     Per HPI unless specifically indicated above     Objective:    BP (!) 149/87 (BP Location: Left Arm, Patient Position: Sitting, Cuff Size: Normal)   Pulse 92   Temp 97.8 F (36.6 C)   Ht 6' (1.829 m)   Wt 232 lb 3.2 oz (105.3 kg)   SpO2 99%   BMI 31.49 kg/m   Wt Readings from Last 3 Encounters:  05/29/16 232 lb 3.2 oz (105.3 kg)  05/21/16 240 lb (108.9 kg)  05/07/16 240 lb (108.9 kg)    Physical Exam  Constitutional: He is oriented to person, place, and time. He appears well-developed and well-nourished. No distress.  HENT:  Head: Atraumatic.  Eyes: Conjunctivae are normal. Pupils are equal, round, and reactive to light.  Neck: Normal range of motion. Neck supple.  Cardiovascular: Normal rate, normal heart sounds and intact distal pulses.   Pulmonary/Chest: Effort normal and  breath sounds normal. No respiratory distress.  Musculoskeletal: Normal range of motion.  Trace residual edema in left 4th digit ROM and strength intact  Neurological: He is alert and oriented to person, place, and time.  Neurovascularly intact b/l UEs  Skin: Skin is warm and dry.  Lacerations to left hand 3rd-5th digits healing very well, no erythema, streaking, warmth, drainage  Psychiatric: He has a normal mood and affect. His behavior is normal.  Nursing note and vitals reviewed.     Assessment & Plan:   Problem List Items Addressed This Visit    None    Visit Diagnoses    Laceration of left ring finger without foreign body without damage to nail, subsequent encounter    -  Primary   Healing well. Sutures removed today without complication, steri strips applied for tension reduction. Wound care and return precautions reviewed       Follow up plan: Return if symptoms worsen or fail to improve.

## 2016-05-31 ENCOUNTER — Other Ambulatory Visit: Payer: Self-pay

## 2016-05-31 MED ORDER — MUPIROCIN 2 % EX OINT: TOPICAL_OINTMENT | Freq: Two times a day (BID) | CUTANEOUS | 0 refills | Status: DC

## 2016-05-31 NOTE — Telephone Encounter (Signed)
Patient wants to know if he could get an Rx for Mupirocin 2%. He got it last year at [facility] for a puncture wound and it healed up a lot faster. He's been putting peroxide and neosporin on it.

## 2016-06-20 ENCOUNTER — Other Ambulatory Visit: Payer: Self-pay | Admitting: Family Medicine

## 2016-06-20 DIAGNOSIS — G8929 Other chronic pain: Secondary | ICD-10-CM

## 2016-06-20 DIAGNOSIS — M25561 Pain in right knee: Principal | ICD-10-CM

## 2016-06-26 ENCOUNTER — Ambulatory Visit (INDEPENDENT_AMBULATORY_CARE_PROVIDER_SITE_OTHER): Payer: BLUE CROSS/BLUE SHIELD | Admitting: Family Medicine

## 2016-06-26 ENCOUNTER — Encounter: Payer: Self-pay | Admitting: Family Medicine

## 2016-06-26 VITALS — BP 132/91 | HR 88 | Ht 72.0 in | Wt 228.0 lb

## 2016-06-26 DIAGNOSIS — M545 Low back pain: Secondary | ICD-10-CM | POA: Diagnosis not present

## 2016-06-26 DIAGNOSIS — G8929 Other chronic pain: Secondary | ICD-10-CM

## 2016-06-26 DIAGNOSIS — M25561 Pain in right knee: Secondary | ICD-10-CM | POA: Diagnosis not present

## 2016-06-26 DIAGNOSIS — Z79899 Other long term (current) drug therapy: Secondary | ICD-10-CM | POA: Diagnosis not present

## 2016-06-26 DIAGNOSIS — K219 Gastro-esophageal reflux disease without esophagitis: Secondary | ICD-10-CM

## 2016-06-26 MED ORDER — MELOXICAM 15 MG PO TABS: 15.0000 mg | ORAL_TABLET | Freq: Every day | ORAL | 7 refills | Status: DC

## 2016-06-26 MED ORDER — OMEPRAZOLE 20 MG PO CPDR: DELAYED_RELEASE_CAPSULE | ORAL | 12 refills | Status: DC

## 2016-06-26 MED ORDER — TRAMADOL HCL 50 MG PO TABS: 50.0000 mg | ORAL_TABLET | Freq: Four times a day (QID) | ORAL | 5 refills | Status: DC | PRN

## 2016-06-26 NOTE — Assessment & Plan Note (Signed)
Review notes from Dr. men and will continue current care and treatment of patient's chronic osteoarthritis in his right knee. We'll continue tramadol as prescribed.

## 2016-06-26 NOTE — Assessment & Plan Note (Signed)
The current medical regimen is effective;  continue present plan and medications.  

## 2016-06-26 NOTE — Progress Notes (Signed)
BP (!) 132/91   Pulse 88   Ht 6' (1.829 m)   Wt 228 lb (103.4 kg)   SpO2 99%   BMI 30.92 kg/m    Subjective:    Patient ID: Peter [name], male    DOB: [birth year], 61 y.o.   MRN: [id number]  HPI: Peter [name] is a 61 y.o. male  Patient follow-up doing well reviewed notes from orthopedics about chronic degenerative arthritis changes of his knee with no real change in treatment recommended will continue tramadol as patient's done well on current regimen. Takes occasional meloxicam. Takes Prilosec for reflux and does well.  Relevant past medical, surgical, family and social history reviewed and updated as indicated. Interim medical history since our last visit reviewed. Allergies and medications reviewed and updated.  Review of Systems  Constitutional: Negative.   Respiratory: Negative.   Cardiovascular: Negative.     Per HPI unless specifically indicated above     Objective:    BP (!) 132/91   Pulse 88   Ht 6' (1.829 m)   Wt 228 lb (103.4 kg)   SpO2 99%   BMI 30.92 kg/m   Wt Readings from Last 3 Encounters:  06/26/16 228 lb (103.4 kg)  05/29/16 232 lb 3.2 oz (105.3 kg)  05/21/16 240 lb (108.9 kg)    Physical Exam  Constitutional: He is oriented to person, place, and time. He appears well-developed and well-nourished.  HENT:  Head: Normocephalic and atraumatic.  Eyes: Conjunctivae and EOM are normal.  Neck: Normal range of motion.  Cardiovascular: Normal rate, regular rhythm and normal heart sounds.   Pulmonary/Chest: Effort normal and breath sounds normal.  Musculoskeletal: Normal range of motion.  Neurological: He is alert and oriented to person, place, and time.  Skin: No erythema.  Psychiatric: He has a normal mood and affect. His behavior is normal. Judgment and thought content normal.    Results for orders placed or performed in visit on 12/27/15  CBC with Differential/Platelet  Result Value Ref Range   WBC 6.9 3.4 - 10.8 x10E3/uL   RBC 4.46  4.14 - 5.80 x10E6/uL   Hemoglobin 13.9 12.6 - 17.7 g/dL   Hematocrit 11.9 14.7 - 51.0 %   MCV 91 79 - 97 fL   MCH 31.2 26.6 - 33.0 pg   MCHC 34.2 31.5 - 35.7 g/dL   RDW 82.9 56.2 - 13.0 %   Platelets 413 (H) 150 - 379 x10E3/uL   Neutrophils 62 Not Estab. %   Lymphs 24 Not Estab. %   Monocytes 10 Not Estab. %   Eos 4 Not Estab. %   Basos 0 Not Estab. %   Neutrophils Absolute 4.2 1.4 - 7.0 x10E3/uL   Lymphocytes Absolute 1.7 0.7 - 3.1 x10E3/uL   Monocytes Absolute 0.7 0.1 - 0.9 x10E3/uL   EOS (ABSOLUTE) 0.3 0.0 - 0.4 x10E3/uL   Basophils Absolute 0.0 0.0 - 0.2 x10E3/uL   Immature Granulocytes 0 Not Estab. %   Immature Grans (Abs) 0.0 0.0 - 0.1 x10E3/uL  Comprehensive metabolic panel  Result Value Ref Range   Glucose 87 65 - 99 mg/dL   BUN 21 6 - 24 mg/dL   Creatinine, Ser 8.65 0.76 - 1.27 mg/dL   GFR calc non Af Amer 62 >59 mL/min/1.73   GFR calc Af Amer 72 >59 mL/min/1.73   BUN/Creatinine Ratio 17 9 - 20   Sodium 139 134 - 144 mmol/L   Potassium 4.2 3.5 - 5.2 mmol/L   Chloride 99  96 - 106 mmol/L   CO2 23 18 - 29 mmol/L   Calcium 9.5 8.7 - 10.2 mg/dL   Total Protein 7.1 6.0 - 8.5 g/dL   Albumin 4.6 3.5 - 5.5 g/dL   Globulin, Total 2.5 1.5 - 4.5 g/dL   Albumin/Globulin Ratio 1.8 1.2 - 2.2   Bilirubin Total 0.3 0.0 - 1.2 mg/dL   Alkaline Phosphatase 77 39 - 117 IU/L   AST 20 0 - 40 IU/L   ALT 17 0 - 44 IU/L  Lipid Profile  Result Value Ref Range   Cholesterol, Total 256 (H) 100 - 199 mg/dL   Triglycerides 811180 (H) 0 - 149 mg/dL   HDL 48 >91>39 mg/dL   VLDL Cholesterol Cal 36 5 - 40 mg/dL   LDL Calculated 478172 (H) 0 - 99 mg/dL   Chol/HDL Ratio 5.3 (H) 0.0 - 5.0 ratio units  PSA  Result Value Ref Range   Prostate Specific Ag, Serum 7.2 (H) 0.0 - 4.0 ng/mL  TSH  Result Value Ref Range   TSH 1.290 0.450 - 4.500 uIU/mL  Urinalysis, Routine w reflex microscopic (not [facility])  Result Value Ref Range   Specific Gravity, UA 1.020 1.005 - 1.030   pH, UA 5.5 5.0 - 7.5   Color,  UA Yellow Yellow   Appearance Ur Clear Clear   Leukocytes, UA Negative Negative   Protein, UA Negative Negative/Trace   Glucose, UA Negative Negative   Ketones, UA Negative Negative   RBC, UA Negative Negative   Bilirubin, UA Negative Negative   Urobilinogen, Ur 0.2 0.2 - 1.0 mg/dL   Nitrite, UA Negative Negative      Assessment & Plan:   Problem List Items Addressed This Visit      Digestive   Acid reflux    The current medical regimen is effective;  continue present plan and medications.       Relevant Medications   omeprazole (PRILOSEC) 20 MG capsule     Other   Chronic low back pain   Relevant Medications   meloxicam (MOBIC) 15 MG tablet   traMADol (ULTRAM) 50 MG tablet   Chronic pain of right knee    Review notes from Dr. men and will continue current care and treatment of patient's chronic osteoarthritis in his right knee. We'll continue tramadol as prescribed.      Relevant Medications   meloxicam (MOBIC) 15 MG tablet   traMADol (ULTRAM) 50 MG tablet    Other Visit Diagnoses    Encounter for medication management    -  Primary       Follow up plan: Return in about 6 months (around 12/27/2016) for Physical Exam.

## 2016-09-09 DIAGNOSIS — H5213 Myopia, bilateral: Secondary | ICD-10-CM | POA: Diagnosis not present

## 2016-09-09 DIAGNOSIS — H25012 Cortical age-related cataract, left eye: Secondary | ICD-10-CM | POA: Diagnosis not present

## 2017-01-09 ENCOUNTER — Ambulatory Visit (INDEPENDENT_AMBULATORY_CARE_PROVIDER_SITE_OTHER): Payer: BLUE CROSS/BLUE SHIELD | Admitting: Family Medicine

## 2017-01-09 ENCOUNTER — Encounter: Payer: BLUE CROSS/BLUE SHIELD | Admitting: Family Medicine

## 2017-01-09 ENCOUNTER — Encounter: Payer: Self-pay | Admitting: Family Medicine

## 2017-01-09 VITALS — BP 153/97 | HR 95 | Ht 72.25 in | Wt 227.0 lb

## 2017-01-09 DIAGNOSIS — M25561 Pain in right knee: Secondary | ICD-10-CM

## 2017-01-09 DIAGNOSIS — Z0000 Encounter for general adult medical examination without abnormal findings: Secondary | ICD-10-CM | POA: Diagnosis not present

## 2017-01-09 DIAGNOSIS — G8929 Other chronic pain: Secondary | ICD-10-CM

## 2017-01-09 DIAGNOSIS — M545 Low back pain: Secondary | ICD-10-CM

## 2017-01-09 DIAGNOSIS — K219 Gastro-esophageal reflux disease without esophagitis: Secondary | ICD-10-CM

## 2017-01-09 MED ORDER — MELOXICAM 15 MG PO TABS: 15.0000 mg | ORAL_TABLET | Freq: Every day | ORAL | 6 refills | Status: DC

## 2017-01-09 MED ORDER — OMEPRAZOLE 20 MG PO CPDR: DELAYED_RELEASE_CAPSULE | ORAL | 12 refills | Status: DC

## 2017-01-09 MED ORDER — TRAMADOL HCL 50 MG PO TABS: 50.0000 mg | ORAL_TABLET | Freq: Four times a day (QID) | ORAL | 5 refills | Status: DC | PRN

## 2017-01-09 NOTE — Progress Notes (Signed)
BP (!) 153/97   Pulse 95   Ht 6' 0.25" (1.835 m)   Wt 227 lb (103 kg)   SpO2 96%   BMI 30.57 kg/m    Subjective:    Patient ID: Peter [name], male    DOB: April 18, 1955, 61 y.o.   MRN: [id number]  HPI: Peter [name] is a 61 y.o. male  Annual exam  Patient all in all doing well with tramadol takes for right knee status post old trauma along with low back with degenerative arthritis Patient takes 2 tablets 3 times a day for a total of 6 tablets a day. This uses his been consistent for years now. This controls his pain and allows him to continue to work without problems. Meloxicam seems to help takes on most days. Reflux controlled pretty good with omeprazole takes 20 mg twice a day.  Relevant past medical, surgical, family and social history reviewed and updated as indicated. Interim medical history since our last visit reviewed. Allergies and medications reviewed and updated.  Review of Systems  Constitutional: Negative.   HENT: Negative.   Eyes: Negative.   Respiratory: Negative.   Cardiovascular: Negative.   Gastrointestinal: Negative.   Endocrine: Negative.   Genitourinary: Negative.   Musculoskeletal: Negative.   Skin: Negative.   Allergic/Immunologic: Negative.   Neurological: Negative.   Hematological: Negative.   Psychiatric/Behavioral: Negative.     Per HPI unless specifically indicated above     Objective:    BP (!) 153/97   Pulse 95   Ht 6' 0.25" (1.835 m)   Wt 227 lb (103 kg)   SpO2 96%   BMI 30.57 kg/m   Wt Readings from Last 3 Encounters:  01/09/17 227 lb (103 kg)  06/26/16 228 lb (103.4 kg)  05/29/16 232 lb 3.2 oz (105.3 kg)    Physical Exam  Constitutional: He is oriented to person, place, and time. He appears well-developed and well-nourished.  HENT:  Head: Normocephalic and atraumatic.  Right Ear: External ear normal.  Left Ear: External ear normal.  Eyes: Conjunctivae and EOM are normal. Pupils are equal, round, and reactive to  light.  Neck: Normal range of motion. Neck supple.  Cardiovascular: Normal rate, regular rhythm, normal heart sounds and intact distal pulses.  Pulmonary/Chest: Effort normal and breath sounds normal.  Abdominal: Soft. Bowel sounds are normal. There is no splenomegaly or hepatomegaly.  Genitourinary: Rectum normal, prostate normal and penis normal.  Musculoskeletal: Normal range of motion.  Neurological: He is alert and oriented to person, place, and time. He has normal reflexes.  Skin: No rash noted. No erythema.  Psychiatric: He has a normal mood and affect. His behavior is normal. Judgment and thought content normal.    Results for orders placed or performed in visit on 12/27/15  CBC with Differential/Platelet  Result Value Ref Range   WBC 6.9 3.4 - 10.8 x10E3/uL   RBC 4.46 4.14 - 5.80 x10E6/uL   Hemoglobin 13.9 12.6 - 17.7 g/dL   Hematocrit 40.9 81.1 - 51.0 %   MCV 91 79 - 97 fL   MCH 31.2 26.6 - 33.0 pg   MCHC 34.2 31.5 - 35.7 g/dL   RDW 91.4 78.2 - 95.6 %   Platelets 413 (H) 150 - 379 x10E3/uL   Neutrophils 62 Not Estab. %   Lymphs 24 Not Estab. %   Monocytes 10 Not Estab. %   Eos 4 Not Estab. %   Basos 0 Not Estab. %   Neutrophils Absolute 4.2  1.4 - 7.0 x10E3/uL   Lymphocytes Absolute 1.7 0.7 - 3.1 x10E3/uL   Monocytes Absolute 0.7 0.1 - 0.9 x10E3/uL   EOS (ABSOLUTE) 0.3 0.0 - 0.4 x10E3/uL   Basophils Absolute 0.0 0.0 - 0.2 x10E3/uL   Immature Granulocytes 0 Not Estab. %   Immature Grans (Abs) 0.0 0.0 - 0.1 x10E3/uL  Comprehensive metabolic panel  Result Value Ref Range   Glucose 87 65 - 99 mg/dL   BUN 21 6 - 24 mg/dL   Creatinine, Ser 8.651.27 0.76 - 1.27 mg/dL   GFR calc non Af Amer 62 >59 mL/min/1.73   GFR calc Af Amer 72 >59 mL/min/1.73   BUN/Creatinine Ratio 17 9 - 20   Sodium 139 134 - 144 mmol/L   Potassium 4.2 3.5 - 5.2 mmol/L   Chloride 99 96 - 106 mmol/L   CO2 23 18 - 29 mmol/L   Calcium 9.5 8.7 - 10.2 mg/dL   Total Protein 7.1 6.0 - 8.5 g/dL   Albumin  4.6 3.5 - 5.5 g/dL   Globulin, Total 2.5 1.5 - 4.5 g/dL   Albumin/Globulin Ratio 1.8 1.2 - 2.2   Bilirubin Total 0.3 0.0 - 1.2 mg/dL   Alkaline Phosphatase 77 39 - 117 IU/L   AST 20 0 - 40 IU/L   ALT 17 0 - 44 IU/L  Lipid Profile  Result Value Ref Range   Cholesterol, Total 256 (H) 100 - 199 mg/dL   Triglycerides 784180 (H) 0 - 149 mg/dL   HDL 48 >69>39 mg/dL   VLDL Cholesterol Cal 36 5 - 40 mg/dL   LDL Calculated 629172 (H) 0 - 99 mg/dL   Chol/HDL Ratio 5.3 (H) 0.0 - 5.0 ratio units  PSA  Result Value Ref Range   Prostate Specific Ag, Serum 7.2 (H) 0.0 - 4.0 ng/mL  TSH  Result Value Ref Range   TSH 1.290 0.450 - 4.500 uIU/mL  Urinalysis, Routine w reflex microscopic (not at [facility])  Result Value Ref Range   Specific Gravity, UA 1.020 1.005 - 1.030   pH, UA 5.5 5.0 - 7.5   Color, UA Yellow Yellow   Appearance Ur Clear Clear   Leukocytes, UA Negative Negative   Protein, UA Negative Negative/Trace   Glucose, UA Negative Negative   Ketones, UA Negative Negative   RBC, UA Negative Negative   Bilirubin, UA Negative Negative   Urobilinogen, Ur 0.2 0.2 - 1.0 mg/dL   Nitrite, UA Negative Negative      Assessment & Plan:   Problem List Items Addressed This Visit      Digestive   Acid reflux    The current medical regimen is effective;  continue present plan and medications.       Relevant Medications   omeprazole (PRILOSEC) 20 MG capsule     Other   Chronic low back pain    The current medical regimen is effective;  continue present plan and medications.       Relevant Medications   traMADol (ULTRAM) 50 MG tablet   meloxicam (MOBIC) 15 MG tablet   Chronic pain of right knee    The current medical regimen is effective;  continue present plan and medications.       Relevant Medications   traMADol (ULTRAM) 50 MG tablet    Other Visit Diagnoses    PE (physical exam), annual    -  Primary   Relevant Orders   Comprehensive metabolic panel   Lipid panel   CBC with  Differential/Platelet  TSH   Urinalysis, Routine w reflex microscopic   PSA       Follow up plan: Return in about 6 months (around 07/10/2017), or if symptoms worsen or fail to improve, for BMP.

## 2017-01-09 NOTE — Assessment & Plan Note (Signed)
The current medical regimen is effective;  continue present plan and medications.  

## 2017-01-10 ENCOUNTER — Telehealth: Payer: Self-pay | Admitting: Family Medicine

## 2017-01-10 DIAGNOSIS — E7800 Pure hypercholesterolemia, unspecified: Secondary | ICD-10-CM

## 2017-01-10 LAB — COMPREHENSIVE METABOLIC PANEL
ALBUMIN: 4.6 g/dL (ref 3.5–5.5)
ALK PHOS: 71 IU/L (ref 39–117)
ALT: 14 IU/L (ref 0–44)
AST: 19 IU/L (ref 0–40)
Albumin/Globulin Ratio: 2 (ref 1.2–2.2)
BILIRUBIN TOTAL: 0.2 mg/dL (ref 0.0–1.2)
BUN / CREAT RATIO: 14 (ref 9–20)
BUN: 19 mg/dL (ref 6–24)
CO2: 27 mmol/L (ref 20–29)
CREATININE: 1.36 mg/dL — AB (ref 0.76–1.27)
Calcium: 10 mg/dL (ref 8.7–10.2)
Chloride: 100 mmol/L (ref 96–106)
GFR calc non Af Amer: 57 mL/min/{1.73_m2} — ABNORMAL LOW (ref >59)
GFR, EST AFRICAN AMERICAN: 65 mL/min/{1.73_m2} (ref >59)
GLOBULIN, TOTAL: 2.3 g/dL (ref 1.5–4.5)
GLUCOSE: 91 mg/dL (ref 65–99)
Potassium: 4.5 mmol/L (ref 3.5–5.2)
SODIUM: 139 mmol/L (ref 134–144)
TOTAL PROTEIN: 6.9 g/dL (ref 6.0–8.5)

## 2017-01-10 LAB — CBC WITH DIFFERENTIAL/PLATELET
BASOS: 1 %
Basophils Absolute: 0 10*3/uL (ref 0.0–0.2)
EOS (ABSOLUTE): 0.3 10*3/uL (ref 0.0–0.4)
Eos: 5 %
HEMATOCRIT: 39.6 % (ref 37.5–51.0)
HEMOGLOBIN: 13.9 g/dL (ref 13.0–17.7)
IMMATURE GRANS (ABS): 0 10*3/uL (ref 0.0–0.1)
IMMATURE GRANULOCYTES: 0 %
LYMPHS: 26 %
Lymphocytes Absolute: 1.7 10*3/uL (ref 0.7–3.1)
MCH: 31.4 pg (ref 26.6–33.0)
MCHC: 35.1 g/dL (ref 31.5–35.7)
MCV: 90 fL (ref 79–97)
MONOCYTES: 8 %
MONOS ABS: 0.5 10*3/uL (ref 0.1–0.9)
NEUTROS PCT: 60 %
Neutrophils Absolute: 3.9 10*3/uL (ref 1.4–7.0)
Platelets: 442 10*3/uL — ABNORMAL HIGH (ref 150–379)
RBC: 4.42 x10E6/uL (ref 4.14–5.80)
RDW: 13.7 % (ref 12.3–15.4)
WBC: 6.5 10*3/uL (ref 3.4–10.8)

## 2017-01-10 LAB — LIPID PANEL
Chol/HDL Ratio: 5.5 ratio — ABNORMAL HIGH (ref 0.0–5.0)
Cholesterol, Total: 248 mg/dL — ABNORMAL HIGH (ref 100–199)
HDL: 45 mg/dL (ref >39)
LDL Calculated: 160 mg/dL — ABNORMAL HIGH (ref 0–99)
Triglycerides: 213 mg/dL — ABNORMAL HIGH (ref 0–149)
VLDL Cholesterol Cal: 43 mg/dL — ABNORMAL HIGH (ref 5–40)

## 2017-01-10 LAB — TSH: TSH: 2.12 u[IU]/mL (ref 0.450–4.500)

## 2017-01-10 LAB — PSA: PROSTATE SPECIFIC AG, SERUM: 6.9 ng/mL — AB (ref 0.0–4.0)

## 2017-01-10 NOTE — Telephone Encounter (Signed)
Hone call Discussed with patient elevated cholesterol  With third year in a row. Will start medications Recheck for safety and effectiveness later this winter. Reviewed declining renal function patient will stop aspirin and recheck BMP at the same time cholesterol was checked.

## 2017-01-12 ENCOUNTER — Telehealth: Payer: Self-pay | Admitting: Family Medicine

## 2017-01-12 MED ORDER — ATORVASTATIN CALCIUM 40 MG PO TABS: 40.0000 mg | ORAL_TABLET | Freq: Every day | ORAL | 3 refills | Status: DC

## 2017-01-12 NOTE — Telephone Encounter (Signed)
done

## 2017-01-12 NOTE — Telephone Encounter (Signed)
Pt stated that he was supposed to have a cholesterol med called in for him but have not seen anything yet.at [facility]. Please advise.

## 2017-01-12 NOTE — Addendum Note (Signed)
Addended by: Vonita MossRISSMAN, Koleen Celia A on: 01/12/2017 11:35 AM   Modules accepted: Orders

## 2017-04-04 ENCOUNTER — Ambulatory Visit: Payer: Self-pay | Admitting: *Deleted

## 2017-04-04 MED ORDER — OSELTAMIVIR PHOSPHATE 75 MG PO CAPS: 75.0000 mg | ORAL_CAPSULE | Freq: Every day | ORAL | 0 refills | Status: DC

## 2017-04-04 NOTE — Telephone Encounter (Signed)
[facility]

## 2017-04-04 NOTE — Addendum Note (Signed)
Addended by: Roosvelt MaserLANE, RACHEL E on: 04/04/2017 04:54 PM   Modules accepted: Orders

## 2017-04-04 NOTE — Telephone Encounter (Signed)
No answer. Left VM to call back to speak with a nurse regarding his request for Tamiflu.

## 2017-04-04 NOTE — Telephone Encounter (Signed)
Contacted pt regarding request for tamiflu because his mother in law was diagnosed with the flu, and he was told by her MD to have his PCP prescribe him a preventative tamiflu; nurse triage initiated and recommendations made per protocol; will route pt request for tamiflu to [facility]; pt can be contacted [phone]; pt states that [facility] [place], [place].   Reason for Disposition . [1] Influenza EXPOSURE within last 72 hours (3 days) AND [2] NOT HIGH RISK AND [3] strongly requests antiviral medication  Answer Assessment - Initial Assessment Questions 1. 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 DATE of EXPOSURE: "When did the exposure occur?" (e.g., hour, days, weeks)     Hours; mother in law started having symptoms yesterday 3. PREGNANCY: "Is there any chance you are pregnant?" "When was your last menstrual period?"     n/a 4. HIGH RISK for COMPLICATIONS: "Do you have any heart or lung problems? Do you have a weakened immune system?" (e.g., CHF, COPD, asthma, HIV positive, chemotherapy, renal failure, diabetes mellitus, sickle cell anemia)     no 5. SYMPTOMS: "Do you have any symptoms?" (e.g., cough, fever, sore throat, difficulty breathing).     Scratchy throat starting last night  Protocols used: INFLUENZA EXPOSURE-A-AH

## 2017-07-12 ENCOUNTER — Encounter: Payer: Self-pay | Admitting: Family Medicine

## 2017-07-12 ENCOUNTER — Ambulatory Visit (INDEPENDENT_AMBULATORY_CARE_PROVIDER_SITE_OTHER): Payer: BLUE CROSS/BLUE SHIELD | Admitting: Family Medicine

## 2017-07-12 VITALS — BP 131/84 | HR 86 | Wt 231.0 lb

## 2017-07-12 DIAGNOSIS — E7800 Pure hypercholesterolemia, unspecified: Secondary | ICD-10-CM

## 2017-07-12 DIAGNOSIS — G8929 Other chronic pain: Secondary | ICD-10-CM

## 2017-07-12 DIAGNOSIS — M25561 Pain in right knee: Secondary | ICD-10-CM

## 2017-07-12 MED ORDER — ATORVASTATIN CALCIUM 40 MG PO TABS: 40.0000 mg | ORAL_TABLET | Freq: Every day | ORAL | 3 refills | Status: DC

## 2017-07-12 MED ORDER — TRAMADOL HCL 50 MG PO TABS: 50.0000 mg | ORAL_TABLET | Freq: Four times a day (QID) | ORAL | 5 refills | Status: DC | PRN

## 2017-07-12 MED ORDER — NAPROXEN 500 MG PO TABS: 500.0000 mg | ORAL_TABLET | Freq: Two times a day (BID) | ORAL | 4 refills | Status: DC

## 2017-07-12 NOTE — Progress Notes (Signed)
BP 131/84   Pulse 86   Wt 231 lb (104.8 kg)   SpO2 97%   BMI 31.11 kg/m    Subjective:    Patient ID: Peter [name], male    DOB: 11-22-1955, 62 y.o.   MRN: [id number]  HPI: Peter SalinasGeorge T Tischer is a 62 y.o. male  Chief Complaint  Patient presents with  . Follow-up  . Hypertension   Patient all in all doing well with tramadol takes for right knee status post old trauma along with low back with degenerative arthritis Patient takes 2 tablets 3 times a day for a total of 6 tablets a day. This uses his been consistent for years now. This controls his pain and allows him to continue to work without problems.  Wants to try something other than meloxicam will give prescription for Naprosyn. Blood pressures been doing okay. Also wants some Mucinex ointment to have for PRN usage Relevant past medical, surgical, family and social history reviewed and updated as indicated. Interim medical history since our last visit reviewed. Allergies and medications reviewed and updated.  Review of Systems  Constitutional: Negative.   Respiratory: Negative.   Cardiovascular: Negative.     Per HPI unless specifically indicated above     Objective:    BP 131/84   Pulse 86   Wt 231 lb (104.8 kg)   SpO2 97%   BMI 31.11 kg/m   Wt Readings from Last 3 Encounters:  07/12/17 231 lb (104.8 kg)  01/09/17 227 lb (103 kg)  06/26/16 228 lb (103.4 kg)    Physical Exam  Constitutional: He is oriented to person, place, and time. He appears well-developed and well-nourished.  HENT:  Head: Normocephalic and atraumatic.  Eyes: Conjunctivae and EOM are normal.  Neck: Normal range of motion.  Cardiovascular: Normal rate, regular rhythm and normal heart sounds.  Pulmonary/Chest: Effort normal and breath sounds normal.  Musculoskeletal: Normal range of motion.  Neurological: He is alert and oriented to person, place, and time.  Skin: No erythema.  Psychiatric: He has a normal mood and affect. His  behavior is normal. Judgment and thought content normal.    Results for orders placed or performed in visit on 01/09/17  Comprehensive metabolic panel  Result Value Ref Range   Glucose 91 65 - 99 mg/dL   BUN 19 6 - 24 mg/dL   Creatinine, Ser 8.291.36 (H) 0.76 - 1.27 mg/dL   GFR calc non Af Amer 57 (L) >59 mL/min/1.73   GFR calc Af Amer 65 >59 mL/min/1.73   BUN/Creatinine Ratio 14 9 - 20   Sodium 139 134 - 144 mmol/L   Potassium 4.5 3.5 - 5.2 mmol/L   Chloride 100 96 - 106 mmol/L   CO2 27 20 - 29 mmol/L   Calcium 10.0 8.7 - 10.2 mg/dL   Total Protein 6.9 6.0 - 8.5 g/dL   Albumin 4.6 3.5 - 5.5 g/dL   Globulin, Total 2.3 1.5 - 4.5 g/dL   Albumin/Globulin Ratio 2.0 1.2 - 2.2   Bilirubin Total 0.2 0.0 - 1.2 mg/dL   Alkaline Phosphatase 71 39 - 117 IU/L   AST 19 0 - 40 IU/L   ALT 14 0 - 44 IU/L  Lipid panel  Result Value Ref Range   Cholesterol, Total 248 (H) 100 - 199 mg/dL   Triglycerides 562213 (H) 0 - 149 mg/dL   HDL 45 >13>39 mg/dL   VLDL Cholesterol Cal 43 (H) 5 - 40 mg/dL   LDL Calculated 086160 (  H) 0 - 99 mg/dL   Chol/HDL Ratio 5.5 (H) 0.0 - 5.0 ratio  CBC with Differential/Platelet  Result Value Ref Range   WBC 6.5 3.4 - 10.8 x10E3/uL   RBC 4.42 4.14 - 5.80 x10E6/uL   Hemoglobin 13.9 13.0 - 17.7 g/dL   Hematocrit 09.8 11.9 - 51.0 %   MCV 90 79 - 97 fL   MCH 31.4 26.6 - 33.0 pg   MCHC 35.1 31.5 - 35.7 g/dL   RDW 14.7 82.9 - 56.2 %   Platelets 442 (H) 150 - 379 x10E3/uL   Neutrophils 60 Not Estab. %   Lymphs 26 Not Estab. %   Monocytes 8 Not Estab. %   Eos 5 Not Estab. %   Basos 1 Not Estab. %   Neutrophils Absolute 3.9 1.4 - 7.0 x10E3/uL   Lymphocytes Absolute 1.7 0.7 - 3.1 x10E3/uL   Monocytes Absolute 0.5 0.1 - 0.9 x10E3/uL   EOS (ABSOLUTE) 0.3 0.0 - 0.4 x10E3/uL   Basophils Absolute 0.0 0.0 - 0.2 x10E3/uL   Immature Granulocytes 0 Not Estab. %   Immature Grans (Abs) 0.0 0.0 - 0.1 x10E3/uL  TSH  Result Value Ref Range   TSH 2.120 0.450 - 4.500 uIU/mL  PSA  Result  Value Ref Range   Prostate Specific Ag, Serum 6.9 (H) 0.0 - 4.0 ng/mL      Assessment & Plan:   Problem List Items Addressed This Visit      Other   Chronic pain of right knee   Relevant Medications   traMADol (ULTRAM) 50 MG tablet   Hypercholesterolemia - Primary   Relevant Medications   atorvastatin (LIPITOR) 40 MG tablet   Other Relevant Orders   Basic metabolic panel       Follow up plan: Return in about 6 months (around 01/11/2018) for Physical Exam.

## 2017-07-13 LAB — BASIC METABOLIC PANEL
BUN/Creatinine Ratio: 20 (ref 10–24)
BUN: 24 mg/dL (ref 8–27)
CALCIUM: 9.5 mg/dL (ref 8.6–10.2)
CO2: 22 mmol/L (ref 20–29)
CREATININE: 1.2 mg/dL (ref 0.76–1.27)
Chloride: 104 mmol/L (ref 96–106)
GFR calc Af Amer: 76 mL/min/{1.73_m2} (ref >59)
GFR, EST NON AFRICAN AMERICAN: 65 mL/min/{1.73_m2} (ref >59)
Glucose: 80 mg/dL (ref 65–99)
POTASSIUM: 4.2 mmol/L (ref 3.5–5.2)
Sodium: 141 mmol/L (ref 134–144)

## 2017-07-16 ENCOUNTER — Encounter: Payer: Self-pay | Admitting: Family Medicine

## 2017-07-25 ENCOUNTER — Other Ambulatory Visit: Payer: Self-pay | Admitting: Family Medicine

## 2017-07-25 NOTE — Telephone Encounter (Signed)
Mupirocin ointment-patient asked to have it refilled at his last OV, but went to pick it up and it wasn't there.  Last OV:07/12/17 Last refill:05/31/16 MWN:[id number]:[facility] [place], [place] - [address] [phone] (Phone) [phone] (Fax)

## 2017-07-25 NOTE — Telephone Encounter (Signed)
Copied from CRM [phone]. Topic: Quick Communication - Rx Refill/Question >> Jul 25, 2017  9:55 AM Marylen PontoMcneil, Ja-Kwan wrote: Medication: mupirocin ointment (BACTROBAN) 2 %  Pt states he spoe with Dr. Dossie Arbourrissman about refilling the Rx but when he went to pick up his medications this Rx was not refilled.    Preferred Pharmacy (with phone number or street name): [facility] - [place], [place] - [address] [phone] (Phone) [phone] (Fax)   Agent: Please be advised that RX refills may take up to 3 business days. We ask that you follow-up with your pharmacy.

## 2017-07-26 MED ORDER — MUPIROCIN 2 % EX OINT: TOPICAL_OINTMENT | Freq: Two times a day (BID) | CUTANEOUS | 0 refills | Status: DC

## 2017-12-07 ENCOUNTER — Telehealth: Payer: Self-pay | Admitting: Family Medicine

## 2017-12-07 DIAGNOSIS — G8929 Other chronic pain: Secondary | ICD-10-CM

## 2017-12-07 DIAGNOSIS — M25561 Pain in right knee: Principal | ICD-10-CM

## 2017-12-07 NOTE — Telephone Encounter (Signed)
Copied from CRM [id number]. Topic: Quick Communication - Rx Refill/Question >> Dec 07, 2017 11:06 AM Luanna Cole wrote: Medication:traMADol (ULTRAM) 50 MG tablet [[id number]] pt called and stated that this will run out 01/02/18. Pt has physical scheduled 01/17/18. Could we call him enough until 01/17/18. Please advise

## 2017-12-10 MED ORDER — TRAMADOL HCL 50 MG PO TABS: 50.0000 mg | ORAL_TABLET | Freq: Four times a day (QID) | ORAL | 1 refills | Status: DC | PRN

## 2018-01-05 ENCOUNTER — Encounter: Payer: Self-pay | Admitting: Emergency Medicine

## 2018-01-05 ENCOUNTER — Other Ambulatory Visit: Payer: Self-pay

## 2018-01-05 ENCOUNTER — Ambulatory Visit (INDEPENDENT_AMBULATORY_CARE_PROVIDER_SITE_OTHER): Payer: BLUE CROSS/BLUE SHIELD

## 2018-01-05 ENCOUNTER — Ambulatory Visit
Admission: EM | Admit: 2018-01-05 | Discharge: 2018-01-05 | Disposition: A | Discharge status: Home / Self Care | Payer: BLUE CROSS/BLUE SHIELD | Attending: Family Medicine | Admitting: Family Medicine

## 2018-01-05 DIAGNOSIS — M25559 Pain in unspecified hip: Secondary | ICD-10-CM

## 2018-01-05 DIAGNOSIS — M5431 Sciatica, right side: Secondary | ICD-10-CM

## 2018-01-05 DIAGNOSIS — M25551 Pain in right hip: Secondary | ICD-10-CM | POA: Diagnosis not present

## 2018-01-05 DIAGNOSIS — M47816 Spondylosis without myelopathy or radiculopathy, lumbar region: Secondary | ICD-10-CM | POA: Diagnosis not present

## 2018-01-05 MED ORDER — PREDNISONE 20 MG PO TABS: ORAL_TABLET | ORAL | 0 refills | Status: DC

## 2018-01-05 MED ORDER — HYDROCODONE-ACETAMINOPHEN 5-325 MG PO TABS: ORAL_TABLET | ORAL | 0 refills | Status: DC

## 2018-01-05 NOTE — ED Triage Notes (Signed)
Pt c/o right hip pain and back pain and down his right leg. He has h/o back surgery. No known acute injury.
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
## 2018-01-06 ENCOUNTER — Emergency Department
Admission: EM | Admit: 2018-01-06 | Discharge: 2018-01-06 | Disposition: A | Discharge status: Home / Self Care | Payer: BLUE CROSS/BLUE SHIELD | Attending: Emergency Medicine | Admitting: Emergency Medicine

## 2018-01-06 ENCOUNTER — Emergency Department: Payer: BLUE CROSS/BLUE SHIELD

## 2018-01-06 ENCOUNTER — Other Ambulatory Visit: Payer: Self-pay

## 2018-01-06 ENCOUNTER — Encounter: Payer: Self-pay | Admitting: Emergency Medicine

## 2018-01-06 DIAGNOSIS — M7989 Other specified soft tissue disorders: Secondary | ICD-10-CM | POA: Diagnosis not present

## 2018-01-06 DIAGNOSIS — M79604 Pain in right leg: Secondary | ICD-10-CM | POA: Diagnosis not present

## 2018-01-06 DIAGNOSIS — M79661 Pain in right lower leg: Secondary | ICD-10-CM | POA: Insufficient documentation

## 2018-01-06 DIAGNOSIS — Z79899 Other long term (current) drug therapy: Secondary | ICD-10-CM | POA: Diagnosis not present

## 2018-01-06 DIAGNOSIS — I10 Essential (primary) hypertension: Secondary | ICD-10-CM | POA: Insufficient documentation

## 2018-01-06 LAB — BASIC METABOLIC PANEL
Anion gap: 11 (ref 5–15)
BUN: 21 mg/dL — ABNORMAL HIGH (ref 6–20)
CO2: 25 mmol/L (ref 22–32)
Calcium: 9.7 mg/dL (ref 8.9–10.3)
Chloride: 101 mmol/L (ref 98–111)
Creatinine, Ser: 1.25 mg/dL — ABNORMAL HIGH (ref 0.61–1.24)
GFR calc Af Amer: >60 mL/min (ref >60)
GFR calc non Af Amer: >60 mL/min (ref >60)
Glucose, Bld: 140 mg/dL — ABNORMAL HIGH (ref 70–99)
Potassium: 4.5 mmol/L (ref 3.5–5.1)
Sodium: 137 mmol/L (ref 135–145)

## 2018-01-06 LAB — CBC WITH DIFFERENTIAL/PLATELET
Abs Immature Granulocytes: 0.15 10*3/uL — ABNORMAL HIGH (ref 0.00–0.07)
BASOS PCT: 0 %
Basophils Absolute: 0 10*3/uL (ref 0.0–0.1)
Eosinophils Absolute: 0 10*3/uL (ref 0.0–0.5)
Eosinophils Relative: 0 %
HCT: 41.1 % (ref 39.0–52.0)
Hemoglobin: 14 g/dL (ref 13.0–17.0)
Immature Granulocytes: 1 %
LYMPHS ABS: 0.9 10*3/uL (ref 0.7–4.0)
Lymphocytes Relative: 5 %
MCH: 31.6 pg (ref 26.0–34.0)
MCHC: 34.1 g/dL (ref 30.0–36.0)
MCV: 92.8 fL (ref 80.0–100.0)
Monocytes Absolute: 0.3 10*3/uL (ref 0.1–1.0)
Monocytes Relative: 2 %
NEUTROS PCT: 92 %
Neutro Abs: 16.6 10*3/uL — ABNORMAL HIGH (ref 1.7–7.7)
Platelets: 463 10*3/uL — ABNORMAL HIGH (ref 150–400)
RBC: 4.43 MIL/uL (ref 4.22–5.81)
RDW: 11.9 % (ref 11.5–15.5)
Smear Review: NORMAL
WBC: 18 10*3/uL — ABNORMAL HIGH (ref 4.0–10.5)
nRBC: 0 % (ref 0.0–0.2)

## 2018-01-06 LAB — FIBRIN DERIVATIVES D-DIMER (ARMC ONLY): Fibrin derivatives D-dimer (ARMC): 485.45 ng/mL (FEU) (ref 0.00–499.00)

## 2018-01-06 LAB — PROTIME-INR
INR: 0.88
Prothrombin Time: 11.9 seconds (ref 11.4–15.2)

## 2018-01-06 MED ORDER — IOHEXOL 300 MG/ML  SOLN
125.0000 mL | Freq: Once | INTRAMUSCULAR | Status: AC | PRN
  Administered 2018-01-06: 125 mL via INTRAVENOUS

## 2018-01-06 MED ORDER — OXYCODONE-ACETAMINOPHEN 5-325 MG PO TABS: 1.0000 | ORAL_TABLET | ORAL | 0 refills | Status: AC | PRN

## 2018-01-06 MED ORDER — MORPHINE SULFATE (PF) 4 MG/ML IV SOLN
4.0000 mg | Freq: Once | INTRAVENOUS | Status: AC
  Administered 2018-01-06: 4 mg via INTRAVENOUS
  Filled 2018-01-06: qty 1

## 2018-01-06 NOTE — ED Notes (Signed)
Pt wife escorted by myself to place Vicodin in waste basket per wife request. 3 pills total destroyed.

## 2018-01-06 NOTE — ED Triage Notes (Signed)
Here for pain from right knee down. C/o severe pain even when sitting. C/omild swelling but cannot stand on right leg. Went to urgent care and they dx with sciatic pain but pt concern may have DVT.  Pain to calf even laying in bed.  Was given steroids and vicodin with no relief.

## 2018-01-06 NOTE — ED Notes (Signed)
ED Provider at bedside. 

## 2018-01-06 NOTE — Discharge Instructions (Addendum)
Continue the prednisone and finish the full course.  You should take 600 mg of ibuprofen every 6 hours with meals.  You can take the Percocet as needed every 4-6 hours for more severe breakthrough pain. DO NOT TAKE the Vicodin or tramadol at the same time as the Percocet.  You should try to transition to your normal tramadol as soon as possible.  You should follow-up with your primary care doctor.  We also have provided a referral to a neurologist.  Return to the ER for new, worsening, persistent severe pain, weakness or numbness, swelling, rash, fever, or any other new or worsening symptoms that concern you.
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
## 2018-01-17 ENCOUNTER — Ambulatory Visit (INDEPENDENT_AMBULATORY_CARE_PROVIDER_SITE_OTHER): Payer: BLUE CROSS/BLUE SHIELD | Admitting: Family Medicine

## 2018-01-17 ENCOUNTER — Encounter: Payer: Self-pay | Admitting: Family Medicine

## 2018-01-17 VITALS — BP 150/100 | HR 108 | Temp 98.4°F | Ht 71.26 in | Wt 225.0 lb

## 2018-01-17 DIAGNOSIS — Z1211 Encounter for screening for malignant neoplasm of colon: Secondary | ICD-10-CM

## 2018-01-17 DIAGNOSIS — Z1159 Encounter for screening for other viral diseases: Secondary | ICD-10-CM | POA: Diagnosis not present

## 2018-01-17 DIAGNOSIS — R972 Elevated prostate specific antigen [PSA]: Secondary | ICD-10-CM

## 2018-01-17 DIAGNOSIS — Z1329 Encounter for screening for other suspected endocrine disorder: Secondary | ICD-10-CM | POA: Diagnosis not present

## 2018-01-17 DIAGNOSIS — Z114 Encounter for screening for human immunodeficiency virus [HIV]: Secondary | ICD-10-CM

## 2018-01-17 DIAGNOSIS — I10 Essential (primary) hypertension: Secondary | ICD-10-CM

## 2018-01-17 DIAGNOSIS — M25561 Pain in right knee: Secondary | ICD-10-CM

## 2018-01-17 DIAGNOSIS — E7800 Pure hypercholesterolemia, unspecified: Secondary | ICD-10-CM

## 2018-01-17 DIAGNOSIS — K219 Gastro-esophageal reflux disease without esophagitis: Secondary | ICD-10-CM

## 2018-01-17 DIAGNOSIS — G8929 Other chronic pain: Secondary | ICD-10-CM

## 2018-01-17 LAB — URINALYSIS, ROUTINE W REFLEX MICROSCOPIC
Bilirubin, UA: NEGATIVE
Glucose, UA: NEGATIVE
Ketones, UA: NEGATIVE
Leukocytes, UA: NEGATIVE
Nitrite, UA: NEGATIVE
Protein, UA: NEGATIVE
RBC, UA: NEGATIVE
Specific Gravity, UA: 1.015 (ref 1.005–1.030)
Urobilinogen, Ur: 0.2 mg/dL (ref 0.2–1.0)
pH, UA: 5.5 (ref 5.0–7.5)

## 2018-01-17 MED ORDER — BENAZEPRIL HCL 40 MG PO TABS: 40.0000 mg | ORAL_TABLET | Freq: Every day | ORAL | 4 refills | Status: DC

## 2018-01-17 MED ORDER — ATORVASTATIN CALCIUM 40 MG PO TABS: 40.0000 mg | ORAL_TABLET | Freq: Every day | ORAL | 4 refills | Status: DC

## 2018-01-17 MED ORDER — TRAMADOL HCL 50 MG PO TABS: 50.0000 mg | ORAL_TABLET | Freq: Four times a day (QID) | ORAL | 1 refills | Status: DC | PRN

## 2018-01-17 MED ORDER — OMEPRAZOLE 20 MG PO CPDR: 20.0000 mg | DELAYED_RELEASE_CAPSULE | Freq: Two times a day (BID) | ORAL | 4 refills | Status: DC

## 2018-01-17 NOTE — Progress Notes (Signed)
BP (!) 150/100   Pulse (!) 108   Temp 98.4 F (36.9 C) (Oral)   Ht 5' 11.26" (1.81 m)   Wt 225 lb (102.1 kg)   SpO2 98%   BMI 31.15 kg/m    Subjective:    Patient ID: Peter [name], male    DOB: 1955/11/10, 62 y.o.   MRN: [id number]  HPI: Peter [name] is a 62 y.o. male  Chief Complaint  Patient presents with  . Annual Exam  . Sciatica    Right side, happened a few weeks ago. lasted a few days. Went to the [employer] at [employer].   Patient's been having great deal of difficulty with sciatica has been through Vicodin also was given a prescription for oxycodone has taken 1 tablet has 14 left in the bottle. Canceled patient to hold bottle for potential future problems  Relevant past medical, surgical, family and social history reviewed and updated as indicated. Interim medical history since our last visit reviewed. Allergies and medications reviewed and updated.  Review of Systems  Constitutional: Negative.   HENT: Negative.   Eyes: Negative.   Respiratory: Negative.   Cardiovascular: Negative.   Gastrointestinal: Negative.   Endocrine: Negative.   Genitourinary: Negative.   Musculoskeletal: Negative.   Skin: Negative.   Allergic/Immunologic: Negative.   Neurological: Negative.   Hematological: Negative.   Psychiatric/Behavioral: Negative.     Per HPI unless specifically indicated above     Objective:    BP (!) 150/100   Pulse (!) 108   Temp 98.4 F (36.9 C) (Oral)   Ht 5' 11.26" (1.81 m)   Wt 225 lb (102.1 kg)   SpO2 98%   BMI 31.15 kg/m   Wt Readings from Last 3 Encounters:  01/17/18 225 lb (102.1 kg)  01/06/18 230 lb (104.3 kg)  01/05/18 230 lb (104.3 kg)    Physical Exam Constitutional:      Appearance: He is well-developed.  HENT:     Head: Normocephalic and atraumatic.     Right Ear: External ear normal.     Left Ear: External ear normal.  Eyes:     Conjunctiva/sclera: Conjunctivae normal.     Pupils: Pupils are equal, round, and reactive  to light.  Neck:     Musculoskeletal: Normal range of motion and neck supple.  Cardiovascular:     Rate and Rhythm: Normal rate and regular rhythm.     Heart sounds: Normal heart sounds.  Pulmonary:     Effort: Pulmonary effort is normal.     Breath sounds: Normal breath sounds.  Abdominal:     General: Bowel sounds are normal.     Palpations: Abdomen is soft. There is no hepatomegaly or splenomegaly.  Genitourinary:    Penis: Normal.      Comments: Refused as has diarrhea Musculoskeletal: Normal range of motion.  Skin:    Findings: No erythema or rash.  Neurological:     Mental Status: He is alert and oriented to person, place, and time.     Deep Tendon Reflexes: Reflexes are normal and symmetric.  Psychiatric:        Behavior: Behavior normal.        Thought Content: Thought content normal.        Judgment: Judgment normal.     Results for orders placed or performed during the hospital encounter of 01/06/18  Basic metabolic panel  Result Value Ref Range   Sodium 137 135 - 145 mmol/L   Potassium 4.5  3.5 - 5.1 mmol/L   Chloride 101 98 - 111 mmol/L   CO2 25 22 - 32 mmol/L   Glucose, Bld 140 (H) 70 - 99 mg/dL   BUN 21 (H) 6 - 20 mg/dL   Creatinine, Ser 1.611.25 (H) 0.61 - 1.24 mg/dL   Calcium 9.7 8.9 - 09.610.3 mg/dL   GFR calc non Af Amer >60 >60 mL/min   GFR calc Af Amer >60 >60 mL/min   Anion gap 11 5 - 15  CBC with Differential  Result Value Ref Range   WBC 18.0 (H) 4.0 - 10.5 K/uL   RBC 4.43 4.22 - 5.81 MIL/uL   Hemoglobin 14.0 13.0 - 17.0 g/dL   HCT 04.541.1 40.939.0 - 81.152.0 %   MCV 92.8 80.0 - 100.0 fL   MCH 31.6 26.0 - 34.0 pg   MCHC 34.1 30.0 - 36.0 g/dL   RDW 91.411.9 78.211.5 - 95.615.5 %   Platelets 463 (H) 150 - 400 K/uL   nRBC 0.0 0.0 - 0.2 %   Neutrophils Relative % 92 %   Neutro Abs 16.6 (H) 1.7 - 7.7 K/uL   Lymphocytes Relative 5 %   Lymphs Abs 0.9 0.7 - 4.0 K/uL   Monocytes Relative 2 %   Monocytes Absolute 0.3 0.1 - 1.0 K/uL   Eosinophils Relative 0 %   Eosinophils  Absolute 0.0 0.0 - 0.5 K/uL   Basophils Relative 0 %   Basophils Absolute 0.0 0.0 - 0.1 K/uL   WBC Morphology MORPHOLOGY UNREMARKABLE    RBC Morphology MORPHOLOGY UNREMARKABLE    Smear Review Normal platelet morphology    Immature Granulocytes 1 %   Abs Immature Granulocytes 0.15 (H) 0.00 - 0.07 K/uL  Protime-INR  Result Value Ref Range   Prothrombin Time 11.9 11.4 - 15.2 seconds   INR 0.88   Fibrin derivatives D-Dimer  Result Value Ref Range   Fibrin derivatives D-dimer ([facility]) 485.45 0.00 - 499.00 ng/mL (FEU)      Assessment & Plan:   Problem List Items Addressed This Visit      Cardiovascular and Mediastinum   Hypertension    Discuss hypertension risk benefits with high stress.  Of his life going on now will start benazepril 40 mg and recheck later this winter to assess good control.      Relevant Medications   atorvastatin (LIPITOR) 40 MG tablet   benazepril (LOTENSIN) 40 MG tablet     Digestive   Acid reflux    The current medical regimen is effective;  continue present plan and medications.       Relevant Medications   omeprazole (PRILOSEC) 20 MG capsule     Other   Chronic pain of right knee   Relevant Medications   traMADol (ULTRAM) 50 MG tablet   Elevated PSA - Primary   Relevant Orders   PSA   Hypercholesterolemia   Relevant Medications   atorvastatin (LIPITOR) 40 MG tablet   benazepril (LOTENSIN) 40 MG tablet   Other Relevant Orders   CBC with Differential/Platelet   Comprehensive metabolic panel   Lipid panel   Urinalysis, Routine w reflex microscopic    Other Visit Diagnoses    Thyroid disorder screen       Relevant Orders   TSH   Need for hepatitis C screening test       Relevant Orders   Hepatitis C antibody   Encounter for screening for HIV       Relevant Orders   HIV antibody (with reflex)  Colon cancer screening           Follow up plan: Return in about 2 months (around 03/20/2018) for BMP.

## 2018-01-17 NOTE — Assessment & Plan Note (Signed)
Discuss hypertension risk benefits with high stress.  Of his life going on now will start benazepril 40 mg and recheck later this winter to assess good control.

## 2018-01-17 NOTE — Assessment & Plan Note (Signed)
The current medical regimen is effective;  continue present plan and medications.  

## 2018-01-18 DIAGNOSIS — H25012 Cortical age-related cataract, left eye: Secondary | ICD-10-CM | POA: Diagnosis not present

## 2018-01-18 DIAGNOSIS — H18413 Arcus senilis, bilateral: Secondary | ICD-10-CM | POA: Diagnosis not present

## 2018-01-18 DIAGNOSIS — H2512 Age-related nuclear cataract, left eye: Secondary | ICD-10-CM | POA: Diagnosis not present

## 2018-01-18 DIAGNOSIS — Z961 Presence of intraocular lens: Secondary | ICD-10-CM | POA: Diagnosis not present

## 2018-01-18 LAB — COMPREHENSIVE METABOLIC PANEL
ALT: 24 IU/L (ref 0–44)
AST: 22 IU/L (ref 0–40)
Albumin/Globulin Ratio: 2.3 — ABNORMAL HIGH (ref 1.2–2.2)
Albumin: 4.6 g/dL (ref 3.6–4.8)
Alkaline Phosphatase: 76 IU/L (ref 39–117)
BUN/Creatinine Ratio: 15 (ref 10–24)
BUN: 21 mg/dL (ref 8–27)
CHLORIDE: 97 mmol/L (ref 96–106)
CO2: 24 mmol/L (ref 20–29)
Calcium: 9.8 mg/dL (ref 8.6–10.2)
Creatinine, Ser: 1.4 mg/dL — ABNORMAL HIGH (ref 0.76–1.27)
GFR calc Af Amer: 63 mL/min/{1.73_m2} (ref >59)
GFR calc non Af Amer: 54 mL/min/{1.73_m2} — ABNORMAL LOW (ref >59)
GLOBULIN, TOTAL: 2 g/dL (ref 1.5–4.5)
Glucose: 92 mg/dL (ref 65–99)
Potassium: 4.6 mmol/L (ref 3.5–5.2)
Sodium: 137 mmol/L (ref 134–144)
Total Protein: 6.6 g/dL (ref 6.0–8.5)

## 2018-01-18 LAB — CBC WITH DIFFERENTIAL/PLATELET
Basophils Absolute: 0.1 10*3/uL (ref 0.0–0.2)
Basos: 1 %
EOS (ABSOLUTE): 0.4 10*3/uL (ref 0.0–0.4)
Eos: 5 %
Hematocrit: 41.7 % (ref 37.5–51.0)
Hemoglobin: 13.9 g/dL (ref 13.0–17.7)
Immature Grans (Abs): 0.1 10*3/uL (ref 0.0–0.1)
Immature Granulocytes: 1 %
LYMPHS ABS: 1.2 10*3/uL (ref 0.7–3.1)
Lymphs: 15 %
MCH: 30.9 pg (ref 26.6–33.0)
MCHC: 33.3 g/dL (ref 31.5–35.7)
MCV: 93 fL (ref 79–97)
MONOS ABS: 0.6 10*3/uL (ref 0.1–0.9)
Monocytes: 8 %
NEUTROS ABS: 5.4 10*3/uL (ref 1.4–7.0)
Neutrophils: 70 %
Platelets: 452 10*3/uL — ABNORMAL HIGH (ref 150–450)
RBC: 4.5 x10E6/uL (ref 4.14–5.80)
RDW: 12.4 % (ref 12.3–15.4)
WBC: 7.8 10*3/uL (ref 3.4–10.8)

## 2018-01-18 LAB — LIPID PANEL
Chol/HDL Ratio: 4.2 ratio (ref 0.0–5.0)
Cholesterol, Total: 242 mg/dL — ABNORMAL HIGH (ref 100–199)
HDL: 58 mg/dL (ref >39)
LDL Calculated: 144 mg/dL — ABNORMAL HIGH (ref 0–99)
Triglycerides: 202 mg/dL — ABNORMAL HIGH (ref 0–149)
VLDL Cholesterol Cal: 40 mg/dL (ref 5–40)

## 2018-01-18 LAB — HEPATITIS C ANTIBODY: Hep C Virus Ab: <0.1 s/co ratio (ref 0.0–0.9)

## 2018-01-18 LAB — HIV ANTIBODY (ROUTINE TESTING W REFLEX): HIV SCREEN 4TH GENERATION: NONREACTIVE

## 2018-01-18 LAB — PSA: Prostate Specific Ag, Serum: 9.2 ng/mL — ABNORMAL HIGH (ref 0.0–4.0)

## 2018-01-18 LAB — TSH: TSH: 4.16 u[IU]/mL (ref 0.450–4.500)

## 2018-01-21 ENCOUNTER — Telehealth: Payer: Self-pay | Admitting: Family Medicine

## 2018-01-21 NOTE — Telephone Encounter (Signed)
-----   Message from Richarda OverlieJada A [place], [place] sent at 01/21/2018  4:38 PM EST ----- Patient was transferred to provider for telephone conversation.

## 2018-01-21 NOTE — Telephone Encounter (Signed)
Phone call Discussed with patient PSA had previously been elevated with repeat testing showing that it had come down now PSA is elevated again will refer patient to urology patient states he will make his own appointment. I discussed with patient if any obstacles we will assist.

## 2018-02-18 DIAGNOSIS — H2512 Age-related nuclear cataract, left eye: Secondary | ICD-10-CM | POA: Diagnosis not present

## 2018-03-14 ENCOUNTER — Ambulatory Visit: Payer: Self-pay | Admitting: Family Medicine

## 2018-03-14 ENCOUNTER — Ambulatory Visit: Payer: BLUE CROSS/BLUE SHIELD | Admitting: Family Medicine

## 2018-03-14 ENCOUNTER — Encounter: Payer: Self-pay | Admitting: Family Medicine

## 2018-03-14 VITALS — BP 132/82 | HR 83 | Temp 98.2°F | Ht 71.25 in | Wt 225.0 lb

## 2018-03-14 DIAGNOSIS — I10 Essential (primary) hypertension: Secondary | ICD-10-CM | POA: Diagnosis not present

## 2018-03-14 DIAGNOSIS — J019 Acute sinusitis, unspecified: Secondary | ICD-10-CM

## 2018-03-14 DIAGNOSIS — M25561 Pain in right knee: Secondary | ICD-10-CM

## 2018-03-14 DIAGNOSIS — R6889 Other general symptoms and signs: Secondary | ICD-10-CM

## 2018-03-14 DIAGNOSIS — M545 Low back pain, unspecified: Secondary | ICD-10-CM

## 2018-03-14 DIAGNOSIS — G8929 Other chronic pain: Secondary | ICD-10-CM

## 2018-03-14 LAB — VERITOR FLU A/B WAIVED
Influenza A: NEGATIVE
Influenza B: NEGATIVE

## 2018-03-14 MED ORDER — BENAZEPRIL HCL 40 MG PO TABS: 40.0000 mg | ORAL_TABLET | Freq: Every day | ORAL | 4 refills | Status: DC

## 2018-03-14 MED ORDER — TRAMADOL HCL 50 MG PO TABS: 50.0000 mg | ORAL_TABLET | Freq: Four times a day (QID) | ORAL | 4 refills | Status: DC | PRN

## 2018-03-14 MED ORDER — AMOXICILLIN-POT CLAVULANATE 875-125 MG PO TABS: 1.0000 | ORAL_TABLET | Freq: Two times a day (BID) | ORAL | 0 refills | Status: DC

## 2018-03-14 NOTE — Assessment & Plan Note (Signed)
Continue tramadol at current dosing approximately 240 a month

## 2018-03-14 NOTE — Assessment & Plan Note (Signed)
The current medical regimen is effective;  continue present plan and medications.  

## 2018-03-14 NOTE — Progress Notes (Signed)
BP 132/82   Pulse 83   Temp 98.2 F (36.8 C) (Oral)   Ht 5' 11.25" (1.81 m)   Wt 225 lb (102.1 kg)   BMI 31.16 kg/m    Subjective:    Patient ID: Peter [name], male    DOB: 05-04-1955, 63 y.o.   MRN: [id number]  HPI: Peter [name] is a 63 y.o. male  Chief Complaint  Patient presents with  . Hypertension  . Chest congestion  Patient with chest congestion head cold sinus pressure drainage getting worse over the last 2 days now having marked coughing feeling bad generalized achiness. Flu tests are negative. Taking blood pressure medication without problems and good control Home blood pressure readings are good. Tramadol has been using a little less than 240 a month for chronic pain which is controlling his pain.  Was not given refills in December at his physical for 6 months we will go ahead and give 4 more months worth of refills.  Relevant past medical, surgical, family and social history reviewed and updated as indicated. Interim medical history since our last visit reviewed. Allergies and medications reviewed and updated.  Review of Systems  Constitutional: Positive for chills and fatigue. Negative for fever.  HENT: Positive for congestion, postnasal drip, rhinorrhea, sinus pressure, sinus pain, sneezing and sore throat.   Respiratory: Positive for cough.   Cardiovascular: Negative.     Per HPI unless specifically indicated above     Objective:    BP 132/82   Pulse 83   Temp 98.2 F (36.8 C) (Oral)   Ht 5' 11.25" (1.81 m)   Wt 225 lb (102.1 kg)   BMI 31.16 kg/m   Wt Readings from Last 3 Encounters:  03/14/18 225 lb (102.1 kg)  01/17/18 225 lb (102.1 kg)  01/06/18 230 lb (104.3 kg)    Physical Exam Constitutional:      Appearance: He is well-developed.  HENT:     Head: Normocephalic and atraumatic.     Right Ear: Tympanic membrane normal.     Left Ear: Tympanic membrane normal.     Nose: Congestion present.     Mouth/Throat:     Pharynx:  Oropharyngeal exudate present.  Eyes:     Conjunctiva/sclera: Conjunctivae normal.  Neck:     Musculoskeletal: Normal range of motion.  Cardiovascular:     Rate and Rhythm: Normal rate and regular rhythm.     Heart sounds: Normal heart sounds.  Pulmonary:     Effort: Pulmonary effort is normal.     Breath sounds: Normal breath sounds.  Musculoskeletal: Normal range of motion.  Skin:    Findings: No erythema.  Neurological:     Mental Status: He is alert and oriented to person, place, and time.  Psychiatric:        Behavior: Behavior normal.        Thought Content: Thought content normal.        Judgment: Judgment normal.     Results for orders placed or performed in visit on 01/17/18  CBC with Differential/Platelet  Result Value Ref Range   WBC 7.8 3.4 - 10.8 x10E3/uL   RBC 4.50 4.14 - 5.80 x10E6/uL   Hemoglobin 13.9 13.0 - 17.7 g/dL   Hematocrit 15.9 45.8 - 51.0 %   MCV 93 79 - 97 fL   MCH 30.9 26.6 - 33.0 pg   MCHC 33.3 31.5 - 35.7 g/dL   RDW 59.2 92.4 - 46.2 %   Platelets 452 (H)  150 - 450 x10E3/uL   Neutrophils 70 Not Estab. %   Lymphs 15 Not Estab. %   Monocytes 8 Not Estab. %   Eos 5 Not Estab. %   Basos 1 Not Estab. %   Neutrophils Absolute 5.4 1.4 - 7.0 x10E3/uL   Lymphocytes Absolute 1.2 0.7 - 3.1 x10E3/uL   Monocytes Absolute 0.6 0.1 - 0.9 x10E3/uL   EOS (ABSOLUTE) 0.4 0.0 - 0.4 x10E3/uL   Basophils Absolute 0.1 0.0 - 0.2 x10E3/uL   Immature Granulocytes 1 Not Estab. %   Immature Grans (Abs) 0.1 0.0 - 0.1 x10E3/uL  Comprehensive metabolic panel  Result Value Ref Range   Glucose 92 65 - 99 mg/dL   BUN 21 8 - 27 mg/dL   Creatinine, Ser 3.66 (H) 0.76 - 1.27 mg/dL   GFR calc non Af Amer 54 (L) >59 mL/min/1.73   GFR calc Af Amer 63 >59 mL/min/1.73   BUN/Creatinine Ratio 15 10 - 24   Sodium 137 134 - 144 mmol/L   Potassium 4.6 3.5 - 5.2 mmol/L   Chloride 97 96 - 106 mmol/L   CO2 24 20 - 29 mmol/L   Calcium 9.8 8.6 - 10.2 mg/dL   Total Protein 6.6 6.0 -  8.5 g/dL   Albumin 4.6 3.6 - 4.8 g/dL   Globulin, Total 2.0 1.5 - 4.5 g/dL   Albumin/Globulin Ratio 2.3 (H) 1.2 - 2.2   Bilirubin Total <0.2 0.0 - 1.2 mg/dL   Alkaline Phosphatase 76 39 - 117 IU/L   AST 22 0 - 40 IU/L   ALT 24 0 - 44 IU/L  Lipid panel  Result Value Ref Range   Cholesterol, Total 242 (H) 100 - 199 mg/dL   Triglycerides 440 (H) 0 - 149 mg/dL   HDL 58 >34 mg/dL   VLDL Cholesterol Cal 40 5 - 40 mg/dL   LDL Calculated 742 (H) 0 - 99 mg/dL   Chol/HDL Ratio 4.2 0.0 - 5.0 ratio  PSA  Result Value Ref Range   Prostate Specific Ag, Serum 9.2 (H) 0.0 - 4.0 ng/mL  TSH  Result Value Ref Range   TSH 4.160 0.450 - 4.500 uIU/mL  Urinalysis, Routine w reflex microscopic  Result Value Ref Range   Specific Gravity, UA 1.015 1.005 - 1.030   pH, UA 5.5 5.0 - 7.5   Color, UA Yellow Yellow   Appearance Ur Clear Clear   Leukocytes, UA Negative Negative   Protein, UA Negative Negative/Trace   Glucose, UA Negative Negative   Ketones, UA Negative Negative   RBC, UA Negative Negative   Bilirubin, UA Negative Negative   Urobilinogen, Ur 0.2 0.2 - 1.0 mg/dL   Nitrite, UA Negative Negative  Hepatitis C antibody  Result Value Ref Range   Hep C Virus Ab <0.1 0.0 - 0.9 s/co ratio  HIV antibody (with reflex)  Result Value Ref Range   HIV Screen 4th Generation wRfx Non Reactive Non Reactive      Assessment & Plan:   Problem List Items Addressed This Visit      Cardiovascular and Mediastinum   Hypertension - Primary    The current medical regimen is effective;  continue present plan and medications.       Relevant Medications   benazepril (LOTENSIN) 40 MG tablet   Other Relevant Orders   Basic metabolic panel     Other   Chronic low back pain    Continue tramadol at current dosing approximately 240 a month  Relevant Medications   traMADol (ULTRAM) 50 MG tablet   Chronic pain of right knee   Relevant Medications   traMADol (ULTRAM) 50 MG tablet    Other Visit  Diagnoses    Flu-like symptoms       Relevant Orders   Veritor Flu A/B Waived   Acute sinusitis, recurrence not specified, unspecified location       Relevant Medications   amoxicillin-clavulanate (AUGMENTIN) 875-125 MG tablet    Discussed sinusitis care and treatment use of antibiotics over-the-counter medications.  Follow up plan: Return for June med check , BMP,  Lipids, ALT, AST.

## 2018-07-15 ENCOUNTER — Other Ambulatory Visit: Payer: Self-pay

## 2018-07-15 ENCOUNTER — Encounter: Payer: Self-pay | Admitting: Family Medicine

## 2018-07-15 ENCOUNTER — Ambulatory Visit (INDEPENDENT_AMBULATORY_CARE_PROVIDER_SITE_OTHER): Payer: BC Managed Care – PPO | Admitting: Family Medicine

## 2018-07-15 DIAGNOSIS — I10 Essential (primary) hypertension: Secondary | ICD-10-CM | POA: Diagnosis not present

## 2018-07-15 DIAGNOSIS — E7800 Pure hypercholesterolemia, unspecified: Secondary | ICD-10-CM

## 2018-07-15 DIAGNOSIS — M25561 Pain in right knee: Secondary | ICD-10-CM

## 2018-07-15 DIAGNOSIS — G8929 Other chronic pain: Secondary | ICD-10-CM

## 2018-07-15 MED ORDER — TRAMADOL HCL 50 MG PO TABS: 50.0000 mg | ORAL_TABLET | Freq: Four times a day (QID) | ORAL | 4 refills | Status: DC | PRN

## 2018-07-15 NOTE — Progress Notes (Signed)
   There were no vitals taken for this visit.   Subjective:    Patient ID: Peter [name], male    DOB: 13-Jul-1955, 63 y.o.   MRN: [id number]  HPI: Peter [name] is a 63 y.o. male  Med check  Telemedicine using audio/video telecommunications for a synchronous communication visit. Today's visit due to COVID-19 isolation precautions I connected with and verified that I am speaking with the correct person using two identifiers.   I discussed the limitations, risks, security and privacy concerns of performing an evaluation and management service by telecommunication and the availability of in person appointments. I also discussed with the patient that there may be a patient responsible charge related to this service. The patient expressed understanding and agreed to proceed. The patient's location is home. I am at home.  Discussed with patient doing stable with medications no complaints blood pressure good control along with cholesterol no issues reflux stable with medications. Chronic knee pain back pain stable with tramadol.  Patient's usage has remained stable with no increase  Relevant past medical, surgical, family and social history reviewed and updated as indicated. Interim medical history since our last visit reviewed. Allergies and medications reviewed and updated.  Review of Systems  Constitutional: Negative.   Respiratory: Negative.   Cardiovascular: Negative.     Per HPI unless specifically indicated above     Objective:    There were no vitals taken for this visit.  Wt Readings from Last 3 Encounters:  03/14/18 225 lb (102.1 kg)  01/17/18 225 lb (102.1 kg)  01/06/18 230 lb (104.3 kg)    Physical Exam  Results for orders placed or performed in visit on 03/14/18  Veritor Flu A/B Waived  Result Value Ref Range   Influenza A Negative Negative   Influenza B Negative Negative      Assessment & Plan:   Problem List Items Addressed This Visit      Cardiovascular and Mediastinum   Hypertension    The current medical regimen is effective;  continue present plan and medications.       Relevant Orders   Basic metabolic panel     Other   Chronic pain of right knee   Relevant Medications   traMADol (ULTRAM) 50 MG tablet   Hypercholesterolemia    The current medical regimen is effective;  continue present plan and medications.       Relevant Orders   LP+ALT+AST [place], [place]     I discussed the assessment and treatment plan with the patient. The patient was provided an opportunity to ask questions and all were answered. The patient agreed with the plan and demonstrated an understanding of the instructions.   The patient was advised to call back or seek an in-person evaluation if the symptoms worsen or if the condition fails to improve as anticipated.   I provided 21+ minutes of time during this encounter.  Follow up plan: Return in about 6 months (around 01/14/2019) for Physical Exam.

## 2018-07-15 NOTE — Assessment & Plan Note (Signed)
The current medical regimen is effective;  continue present plan and medications.  

## 2018-12-16 ENCOUNTER — Encounter: Payer: Self-pay | Admitting: Family Medicine

## 2018-12-18 ENCOUNTER — Other Ambulatory Visit: Payer: Self-pay | Admitting: Family Medicine

## 2018-12-18 NOTE — Telephone Encounter (Signed)
Medication Refill - Medication: benazepril (LOTENSIN) 40 MG tablet  Pt states that Pharmacy faxed it over days ago    Has the patient contacted their pharmacy? Yes.   (Agent: If no, request that the patient contact the pharmacy for the refill.) (Agent: If yes, when and what did the pharmacy advise?)  Preferred Pharmacy (with phone number or street name):  [facility] [place], [address]  [place] [address]  Phone: [phone] Fax: [phone]     Agent: Please be advised that RX refills may take up to 3 business days. We ask that you follow-up with your pharmacy.

## 2018-12-19 MED ORDER — BENAZEPRIL HCL 40 MG PO TABS: 40.0000 mg | ORAL_TABLET | Freq: Every day | ORAL | 0 refills | Status: DC

## 2018-12-19 NOTE — Telephone Encounter (Signed)
Routing to provider. Patient has seen in June and has f/up in December.

## 2019-01-16 ENCOUNTER — Ambulatory Visit: Payer: Self-pay | Admitting: Family Medicine

## 2019-01-20 ENCOUNTER — Other Ambulatory Visit: Payer: Self-pay | Admitting: Family Medicine

## 2019-01-20 MED ORDER — BENAZEPRIL HCL 40 MG PO TABS: 40.0000 mg | ORAL_TABLET | Freq: Every day | ORAL | 0 refills | Status: DC

## 2019-01-20 NOTE — Telephone Encounter (Signed)
Requested medication (s) are due for refill today: yes  Requested medication (s) are on the active medication list: yes  Last refill:  12/18/18  Future visit scheduled: yes  Notes to clinic: Patient has one pill left Has up coming appointment   Requested Prescriptions  Pending Prescriptions Disp Refills   benazepril (LOTENSIN) 40 MG tablet 30 tablet 0    Sig: Take 1 tablet (40 mg total) by mouth daily.      Cardiovascular:  ACE Inhibitors Failed - 01/20/2019  9:05 AM      Failed - Cr in normal range and within 180 days    Creatinine, Ser  Date Value Ref Range Status  01/17/2018 1.40 (H) 0.76 - 1.27 mg/dL Final          Failed - K in normal range and within 180 days    Potassium  Date Value Ref Range Status  01/17/2018 4.6 3.5 - 5.2 mmol/L Final          Failed - Valid encounter within last 6 months    Recent Outpatient Visits           6 months ago Chronic pain of right knee   [facility], [facility]   10 months ago Essential hypertension   [place] [facility], Jeannette How, MD   1 year ago Elevated PSA   [place] [facility], Jeannette How, MD   1 year ago Hypercholesterolemia   [facility], Jeannette How, MD   2 years ago PE (physical exam), annual   [place], [place]       Future Appointments             In 3 days Volney American, PA-C [facility], [place] - Patient is not pregnant      Passed - Last BP in normal range    BP Readings from Last 1 Encounters:  03/14/18 132/82

## 2019-01-20 NOTE — Telephone Encounter (Signed)
Routing to provider  

## 2019-01-20 NOTE — Telephone Encounter (Signed)
Copied from [phone]. Topic: Quick Communication - Rx Refill/Question >> Jan 20, 2019  8:48 AM Rainey Pines A wrote: Medication: benazepril (LOTENSIN) 40 MG tablet (Patient only has 1 pill left.)  Has the patient contacted their pharmacy?Yes (Agent: If no, request that the patient contact the pharmacy for the refill.) (Agent: If yes, when and what did the pharmacy advise?)Contact PCP  Preferred Pharmacy (with phone number or street name): [facility], [place]  Phone:  [phone] Fax:  [phone]     Agent: Please be advised that RX refills may take up to 3 business days. We ask that you follow-up with your pharmacy.

## 2019-01-23 ENCOUNTER — Other Ambulatory Visit: Payer: Self-pay

## 2019-01-23 ENCOUNTER — Encounter: Payer: Self-pay | Admitting: Family Medicine

## 2019-01-23 ENCOUNTER — Ambulatory Visit (INDEPENDENT_AMBULATORY_CARE_PROVIDER_SITE_OTHER): Payer: BC Managed Care – PPO | Admitting: Family Medicine

## 2019-01-23 VITALS — BP 127/84 | HR 88 | Temp 98.3°F | Ht 71.0 in | Wt 222.0 lb

## 2019-01-23 DIAGNOSIS — K219 Gastro-esophageal reflux disease without esophagitis: Secondary | ICD-10-CM

## 2019-01-23 DIAGNOSIS — M545 Low back pain, unspecified: Secondary | ICD-10-CM

## 2019-01-23 DIAGNOSIS — R972 Elevated prostate specific antigen [PSA]: Secondary | ICD-10-CM | POA: Diagnosis not present

## 2019-01-23 DIAGNOSIS — E7800 Pure hypercholesterolemia, unspecified: Secondary | ICD-10-CM | POA: Diagnosis not present

## 2019-01-23 DIAGNOSIS — G8929 Other chronic pain: Secondary | ICD-10-CM

## 2019-01-23 DIAGNOSIS — Z0000 Encounter for general adult medical examination without abnormal findings: Secondary | ICD-10-CM | POA: Diagnosis not present

## 2019-01-23 DIAGNOSIS — M25561 Pain in right knee: Secondary | ICD-10-CM

## 2019-01-23 DIAGNOSIS — I10 Essential (primary) hypertension: Secondary | ICD-10-CM

## 2019-01-23 MED ORDER — TRAMADOL HCL 50 MG PO TABS: 50.0000 mg | ORAL_TABLET | Freq: Four times a day (QID) | ORAL | 2 refills | Status: DC | PRN

## 2019-01-23 MED ORDER — ROSUVASTATIN CALCIUM 10 MG PO TABS: 10.0000 mg | ORAL_TABLET | Freq: Every day | ORAL | 1 refills | Status: DC

## 2019-01-23 MED ORDER — BENAZEPRIL HCL 40 MG PO TABS: 40.0000 mg | ORAL_TABLET | Freq: Every day | ORAL | 1 refills | Status: DC

## 2019-01-23 MED ORDER — OMEPRAZOLE 20 MG PO CPDR: 20.0000 mg | DELAYED_RELEASE_CAPSULE | Freq: Two times a day (BID) | ORAL | 1 refills | Status: DC
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
## 2019-01-24 LAB — CBC WITH DIFFERENTIAL/PLATELET
Basophils Absolute: 0.1 10*3/uL (ref 0.0–0.2)
Basos: 1 %
EOS (ABSOLUTE): 0.5 10*3/uL — ABNORMAL HIGH (ref 0.0–0.4)
Eos: 6 %
Hematocrit: 37.1 % — ABNORMAL LOW (ref 37.5–51.0)
Hemoglobin: 12.7 g/dL — ABNORMAL LOW (ref 13.0–17.7)
Immature Grans (Abs): 0 10*3/uL (ref 0.0–0.1)
Immature Granulocytes: 1 %
Lymphocytes Absolute: 1.6 10*3/uL (ref 0.7–3.1)
Lymphs: 22 %
MCH: 31.9 pg (ref 26.6–33.0)
MCHC: 34.2 g/dL (ref 31.5–35.7)
MCV: 93 fL (ref 79–97)
Monocytes Absolute: 0.5 10*3/uL (ref 0.1–0.9)
Monocytes: 7 %
Neutrophils Absolute: 4.7 10*3/uL (ref 1.4–7.0)
Neutrophils: 63 %
Platelets: 447 10*3/uL (ref 150–450)
RBC: 3.98 x10E6/uL — ABNORMAL LOW (ref 4.14–5.80)
RDW: 12.3 % (ref 11.6–15.4)
WBC: 7.4 10*3/uL (ref 3.4–10.8)

## 2019-01-24 LAB — COMPREHENSIVE METABOLIC PANEL
ALT: 13 IU/L (ref 0–44)
AST: 18 IU/L (ref 0–40)
Albumin/Globulin Ratio: 2.2 (ref 1.2–2.2)
Albumin: 4.6 g/dL (ref 3.8–4.8)
Alkaline Phosphatase: 65 IU/L (ref 39–117)
BUN/Creatinine Ratio: 15 (ref 10–24)
BUN: 23 mg/dL (ref 8–27)
Bilirubin Total: <0.2 mg/dL (ref 0.0–1.2)
CO2: 19 mmol/L — ABNORMAL LOW (ref 20–29)
Calcium: 9.9 mg/dL (ref 8.6–10.2)
Chloride: 103 mmol/L (ref 96–106)
Creatinine, Ser: 1.55 mg/dL — ABNORMAL HIGH (ref 0.76–1.27)
GFR calc Af Amer: 55 mL/min/{1.73_m2} — ABNORMAL LOW (ref >59)
GFR calc non Af Amer: 48 mL/min/{1.73_m2} — ABNORMAL LOW (ref >59)
Globulin, Total: 2.1 g/dL (ref 1.5–4.5)
Glucose: 87 mg/dL (ref 65–99)
Potassium: 5 mmol/L (ref 3.5–5.2)
Sodium: 138 mmol/L (ref 134–144)
Total Protein: 6.7 g/dL (ref 6.0–8.5)

## 2019-01-24 LAB — UA/M W/RFLX CULTURE, ROUTINE
Bilirubin, UA: NEGATIVE
Glucose, UA: NEGATIVE
Ketones, UA: NEGATIVE
Leukocytes,UA: NEGATIVE
Nitrite, UA: NEGATIVE
Protein,UA: NEGATIVE
RBC, UA: NEGATIVE
Specific Gravity, UA: 1.025 (ref 1.005–1.030)
Urobilinogen, Ur: 0.2 mg/dL (ref 0.2–1.0)
pH, UA: 5.5 (ref 5.0–7.5)

## 2019-01-24 LAB — LIPID PANEL W/O CHOL/HDL RATIO
Cholesterol, Total: 289 mg/dL — ABNORMAL HIGH (ref 100–199)
HDL: 44 mg/dL (ref >39)
LDL Chol Calc (NIH): 160 mg/dL — ABNORMAL HIGH (ref 0–99)
Triglycerides: 441 mg/dL — ABNORMAL HIGH (ref 0–149)
VLDL Cholesterol Cal: 85 mg/dL — ABNORMAL HIGH (ref 5–40)

## 2019-01-24 LAB — PSA: Prostate Specific Ag, Serum: 7.1 ng/mL — ABNORMAL HIGH (ref 0.0–4.0)

## 2019-01-24 LAB — TSH: TSH: 1.72 u[IU]/mL (ref 0.450–4.500)

## 2019-01-28 NOTE — Assessment & Plan Note (Signed)
Long discussion today regarding state laws and regulations regarding primary care's role in chronic pain management practices, and made recommendations for referral to pain management which he is open to. Also suggested adding a medication on such as lyrica or cymbalta for further pain control potential. Pt not comfortable with this at this time. Will bridge his tramadol at current dose until he can get established with Pain Management for further monitoring and treatment 

## 2019-01-28 NOTE — Assessment & Plan Note (Signed)
BPs stable and under good control, continue current regimen 

## 2019-01-28 NOTE — Assessment & Plan Note (Signed)
Recheck PSA 

## 2019-01-28 NOTE — Assessment & Plan Note (Addendum)
Recheck lipids, has been off his statin for months due to myalgias. Start crestor and recheck in 6 months

## 2019-01-28 NOTE — Assessment & Plan Note (Signed)
Stable on prilosec, continue current regimen and diet modifications

## 2019-01-28 NOTE — Assessment & Plan Note (Signed)
Long discussion today regarding state laws and regulations regarding primary care's role in chronic pain management practices, and made recommendations for referral to pain management which he is open to. Also suggested adding a medication on such as lyrica or cymbalta for further pain control potential. Pt not comfortable with this at this time. Will bridge his tramadol at current dose until he can get established with Pain Management for further monitoring and treatment

## 2019-03-25 ENCOUNTER — Ambulatory Visit: Payer: BC Managed Care – PPO | Admitting: Family Medicine

## 2019-03-25 ENCOUNTER — Encounter: Payer: Self-pay | Admitting: Family Medicine

## 2019-03-25 ENCOUNTER — Ambulatory Visit (INDEPENDENT_AMBULATORY_CARE_PROVIDER_SITE_OTHER): Payer: BC Managed Care – PPO | Admitting: Family Medicine

## 2019-03-25 DIAGNOSIS — G8929 Other chronic pain: Secondary | ICD-10-CM | POA: Diagnosis not present

## 2019-03-25 DIAGNOSIS — M545 Low back pain: Secondary | ICD-10-CM | POA: Diagnosis not present

## 2019-03-25 DIAGNOSIS — M25561 Pain in right knee: Secondary | ICD-10-CM | POA: Diagnosis not present
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
## 2019-04-17 ENCOUNTER — Ambulatory Visit: Payer: BC Managed Care – PPO | Admitting: Student in an Organized Health Care Education/Training Program

## 2019-04-18 ENCOUNTER — Telehealth: Payer: Self-pay

## 2019-04-18 DIAGNOSIS — M25561 Pain in right knee: Secondary | ICD-10-CM

## 2019-04-18 DIAGNOSIS — G8929 Other chronic pain: Secondary | ICD-10-CM

## 2019-04-18 MED ORDER — TRAMADOL HCL 50 MG PO TABS: 50.0000 mg | ORAL_TABLET | Freq: Four times a day (QID) | ORAL | 2 refills | Status: DC | PRN

## 2019-04-18 NOTE — Telephone Encounter (Signed)
Copied from CRM [id number]. Topic: General - Inquiry >> Apr 18, 2019  7:21 AM Peter [name] wrote: Reason for CRM: pt is requesting rachel to return his call concerning a conversation he had with rachel on 03-25-2019. Pt did not elaborate the reason. Pt does not want a virtual visit etc

## 2019-04-18 NOTE — Telephone Encounter (Signed)
Pt calling for tramadol refill. As discussed, will bridge him 3 months until he can establish with new provider. Rx sent for 3 month supply. Pt appreciative.

## 2019-04-18 NOTE — Addendum Note (Signed)
Addended by: Roosvelt Maser E on: 04/18/2019 11:14 AM   Modules accepted: Orders

## 2019-04-27 ENCOUNTER — Other Ambulatory Visit: Payer: Self-pay

## 2019-04-27 ENCOUNTER — Ambulatory Visit: Payer: BC Managed Care – PPO | Attending: Internal Medicine

## 2019-04-27 DIAGNOSIS — Z23 Encounter for immunization: Secondary | ICD-10-CM

## 2019-04-27 NOTE — Progress Notes (Signed)
   Covid-19 Vaccination Clinic  Name:  Peter [name]    MRN: [id number] DOB: 05-Jun-1955  04/27/2019  Peter [name] was observed post Covid-19 immunization for 15 minutes without incident. He was provided with Vaccine Information Sheet and instruction to access the V-Safe system.   Peter [name] was instructed to call 911 with any severe reactions post vaccine: Marland Kitchen Difficulty breathing  . Swelling of face and throat  . A fast heartbeat  . A bad rash all over body  . Dizziness and weakness   Immunizations Administered    Name Date Dose VIS Date Route   Pfizer COVID-19 Vaccine 04/27/2019  4:34 PM 0.3 mL 01/17/2019 Intramuscular   Manufacturer: [employer], [employer]   Lot: [id number]   NDC: [address]-7

## 2019-05-23 ENCOUNTER — Ambulatory Visit: Payer: BC Managed Care – PPO | Attending: Internal Medicine

## 2019-05-23 DIAGNOSIS — Z23 Encounter for immunization: Secondary | ICD-10-CM

## 2019-05-23 NOTE — Progress Notes (Signed)
   Covid-19 Vaccination Clinic  Name:  Peter [name]    MRN: [id number] DOB: 1955/12/30  05/23/2019  [name] was observed post Covid-19 immunization for 15 minutes without incident. He was provided with Vaccine Information Sheet and instruction to access the V-Safe system.   [name] was instructed to call 911 with any severe reactions post vaccine: Marland Kitchen Difficulty breathing  . Swelling of face and throat  . A fast heartbeat  . A bad rash all over body  . Dizziness and weakness   Immunizations Administered    Name Date Dose VIS Date Route   Pfizer COVID-19 Vaccine 05/23/2019  4:02 PM 0.3 mL 01/17/2019 Intramuscular   Manufacturer: [employer], [employer]   Lot: [id number]   NDC: [address]-8

## 2019-07-15 ENCOUNTER — Telehealth: Payer: Self-pay | Admitting: Family Medicine

## 2019-07-15 NOTE — Telephone Encounter (Signed)
Copied from CRM [id number]. Topic: General - Inquiry >> Jul 15, 2019 10:26 AM Crist Infante wrote: Reason for CRM: pt would like Fleet Contras to call him.  Pt states it is to discuss his meds. Pt did not really want to talk with me about this, but he did say he works out of town and unable to get in for appt, and will need refills.  Then he said, if you will just have her call me please, I would appreciate. >> Jul 15, 2019 10:48 AM Adela Ports M wrote: Is it ok for the patient be seen a week sooner through mychart if he can get his BP since he is low on tramadol and his other routine meds?

## 2019-07-15 NOTE — Telephone Encounter (Signed)
Spoke with pt and he stated that he still isn't willing to go to pain management just due to the fact that he would be out of pain meds for a month. He stated that he was appreciative for what was done for him the last 3 months as this did not have to be done. He stated that he would rather get all of his meds from the same place so he will be seeking care at another office in hopes of not having to go to the pain clinic. Pt again stated how appreciative he was for the care he has received.

## 2019-07-15 NOTE — Telephone Encounter (Signed)
Happy to see him for his chronic condition follow up, but patient will need access to vitals and ability to come by for labs soon if needing virtual. Please remind him that he will be unable to receive more tramadol from me per our last few conversations, only agreed to bridge 3 months (from 04/18/19 note) and he was to establish care elsewhere for further management and refills and has fervently declined going to Pain Clinic when this was offered several times in the past.   Copied from CRM #[id number]. Topic: General - Inquiry >> Jul 15, 2019 10:26 AM Peter [name] wrote: Reason for CRM: pt would like Fleet Contras to call him.  Pt states it is to discuss his meds. Pt did not really want to talk with me about this, but he did say he works out of town and unable to get in for appt, and will need refills.  Then he said, if you will just have her call me please, I would appreciate. >> Jul 15, 2019 10:48 AM Peter [name] wrote: Is it ok for the patient be seen a week sooner through mychart if he can get his BP since he is low on tramadol and his other routine meds?

## 2019-07-16 ENCOUNTER — Encounter: Payer: Self-pay | Admitting: Family Medicine

## 2019-07-16 ENCOUNTER — Ambulatory Visit (INDEPENDENT_AMBULATORY_CARE_PROVIDER_SITE_OTHER): Payer: BC Managed Care – PPO | Admitting: Family Medicine

## 2019-07-16 VITALS — BP 120/74

## 2019-07-16 DIAGNOSIS — E7800 Pure hypercholesterolemia, unspecified: Secondary | ICD-10-CM

## 2019-07-16 DIAGNOSIS — I10 Essential (primary) hypertension: Secondary | ICD-10-CM

## 2019-07-16 DIAGNOSIS — K219 Gastro-esophageal reflux disease without esophagitis: Secondary | ICD-10-CM | POA: Diagnosis not present

## 2019-07-16 DIAGNOSIS — M545 Low back pain: Secondary | ICD-10-CM

## 2019-07-16 DIAGNOSIS — G8929 Other chronic pain: Secondary | ICD-10-CM

## 2019-07-16 NOTE — Progress Notes (Signed)
BP 120/74    Subjective:    Patient ID: Peter [name], male    DOB: 07/12/1955, 64 y.o.   MRN: [id number]  HPI: Peter [name] is a 64 y.o. male  Chief Complaint  Patient presents with   Hyperlipidemia   Hypertension   Pain     This visit was completed via telephone due to the restrictions of the COVID-19 pandemic. All issues as above were discussed and addressed. Physical exam was done as above through visual confirmation on telephone. If it was felt that the patient should be evaluated in the office, they were directed there. The patient verbally consented to this visit.  Location of the patient: home  Location of the provider: work  Those involved with this call:   Provider: Roosvelt Maser, PA-C  CMA: Elton Sin, CMA  Front Desk/Registration: Harriet Pho   Time spent on call: 25 minutes on the phone discussing health concerns. 5 minutes total spent in review of patient's record and preparation of their chart. I verified patient identity using two factors (patient name and date of birth). Patient consents verbally to being seen via telemedicine visit today.   Presenting today for chronic condition f/u. Has lost about 30 lb from the physical nature of his work lately. Overall doing well, taking medication faithfully without side effects. Home BPs running 120s/80s typically. Taking crestor for HLD without myalgias, claudication, CP, SOB. Prilosec working well for GERD sxs, no breakthrough issues. Main concern today is his chronic pain from numerous past injuries. Has declined the Pain Clinic adamantly in the past for take over of his tramadol therapy he's been on for years, and not willing to taper off. Was told both in February and March of this year that he would be bridged 3 months (expiring this month) to give him time to find a new PCP to see about managing this regimen which pt was agreeable to at that time. He states he hasn't had time and wants to keep coming  here. Hoping to be bridged at least until end of October when he can retire and consider Pain Clinic.   Relevant past medical, surgical, family and social history reviewed and updated as indicated. Interim medical history since our last visit reviewed. Allergies and medications reviewed and updated.  Review of Systems  Per HPI unless specifically indicated above     Objective:    BP 120/74   Wt Readings from Last 3 Encounters:  01/23/19 222 lb (100.7 kg)  03/14/18 225 lb (102.1 kg)  01/17/18 225 lb (102.1 kg)    Physical Exam  Unable to perform PE due to patient lack of access to video technology for today's visit.  Results for orders placed or performed in visit on 01/23/19  CBC with Differential/Platelet out  Result Value Ref Range   WBC 7.4 3.4 - 10.8 x10E3/uL   RBC 3.98 (L) 4.14 - 5.80 x10E6/uL   Hemoglobin 12.7 (L) 13.0 - 17.7 g/dL   Hematocrit 83.6 (L) 62.9 - 51.0 %   MCV 93 79 - 97 fL   MCH 31.9 26.6 - 33.0 pg   MCHC 34.2 31 - 35 g/dL   RDW 47.6 54.6 - 50.3 %   Platelets 447 150 - 450 x10E3/uL   Neutrophils 63 Not Estab. %   Lymphs 22 Not Estab. %   Monocytes 7 Not Estab. %   Eos 6 Not Estab. %   Basos 1 Not Estab. %   Neutrophils Absolute 4.7 1 -  7 x10E3/uL   Lymphocytes Absolute 1.6 0 - 3 x10E3/uL   Monocytes Absolute 0.5 0 - 0 x10E3/uL   EOS (ABSOLUTE) 0.5 (H) 0.0 - 0.4 x10E3/uL   Basophils Absolute 0.1 0 - 0 x10E3/uL   Immature Granulocytes 1 Not Estab. %   Immature Grans (Abs) 0.0 0.0 - 0.1 x10E3/uL  Comprehensive metabolic panel  Result Value Ref Range   Glucose 87 65 - 99 mg/dL   BUN 23 8 - 27 mg/dL   Creatinine, Ser 7.78 (H) 0.76 - 1.27 mg/dL   GFR calc non Af Amer 48 (L) >59 mL/min/1.73   GFR calc Af Amer 55 (L) >59 mL/min/1.73   BUN/Creatinine Ratio 15 10 - 24   Sodium 138 134 - 144 mmol/L   Potassium 5.0 3.5 - 5.2 mmol/L   Chloride 103 96 - 106 mmol/L   CO2 19 (L) 20 - 29 mmol/L   Calcium 9.9 8.6 - 10.2 mg/dL   Total Protein 6.7 6.0 - 8.5  g/dL   Albumin 4.6 3.8 - 4.8 g/dL   Globulin, Total 2.1 1.5 - 4.5 g/dL   Albumin/Globulin Ratio 2.2 1.2 - 2.2   Bilirubin Total <0.2 0.0 - 1.2 mg/dL   Alkaline Phosphatase 65 39 - 117 IU/L   AST 18 0 - 40 IU/L   ALT 13 0 - 44 IU/L  Lipid Panel w/o Chol/HDL Ratio out  Result Value Ref Range   Cholesterol, Total 289 (H) 100 - 199 mg/dL   Triglycerides 242 (H) 0 - 149 mg/dL   HDL 44 >35 mg/dL   VLDL Cholesterol Cal 85 (H) 5 - 40 mg/dL   LDL Chol Calc (NIH) 361 (H) 0 - 99 mg/dL  UA/M w/rflx Culture, Routine   Specimen: Urine   URINE  Result Value Ref Range   Specific Gravity, UA 1.025 1.005 - 1.030   pH, UA 5.5 5.0 - 7.5   Color, UA Yellow Yellow   Appearance Ur Clear Clear   Leukocytes,UA Negative Negative   Protein,UA Negative Negative/Trace   Glucose, UA Negative Negative   Ketones, UA Negative Negative   RBC, UA Negative Negative   Bilirubin, UA Negative Negative   Urobilinogen, Ur 0.2 0.2 - 1.0 mg/dL   Nitrite, UA Negative Negative  TSH  Result Value Ref Range   TSH 1.720 0.450 - 4.500 uIU/mL  PSA  Result Value Ref Range   Prostate Specific Ag, Serum 7.1 (H) 0.0 - 4.0 ng/mL      Assessment & Plan:   Problem List Items Addressed This Visit      Cardiovascular and Mediastinum   Hypertension - Primary    BPs stable and WNL, continue current regimen      Relevant Medications   rosuvastatin (CRESTOR) 10 MG tablet   benazepril (LOTENSIN) 40 MG tablet   Other Relevant Orders   Comprehensive metabolic panel     Digestive   Acid reflux    Stable and well controlled, continue current regimen      Relevant Medications   omeprazole (PRILOSEC) 20 MG capsule     Other   Chronic low back pain    Discussed at length with patient that unfortunately I am unable to continue bridging him further on tramadol, referred to our discussion back in March. He states he will start searching for a new PCP to see about getting it sorted. Discussed to let me know if he changes his  mind on going to the pain clinic, and that I'd be happy to  continue managing his other chronic conditions if so. Pt expresses gratitude for efforts.       Hypercholesterolemia    Recheck lipids, adjust as needed. Continue current regimen      Relevant Medications   rosuvastatin (CRESTOR) 10 MG tablet   benazepril (LOTENSIN) 40 MG tablet   Other Relevant Orders   Lipid Panel w/o Chol/HDL Ratio       Follow up plan: Return in about 6 months (around 01/15/2020) for 6 month f/u.

## 2019-07-17 ENCOUNTER — Telehealth: Payer: Self-pay

## 2019-07-17 MED ORDER — OMEPRAZOLE 20 MG PO CPDR: 20.0000 mg | DELAYED_RELEASE_CAPSULE | Freq: Two times a day (BID) | ORAL | 1 refills | Status: DC

## 2019-07-17 MED ORDER — ROSUVASTATIN CALCIUM 10 MG PO TABS: 10.0000 mg | ORAL_TABLET | Freq: Every day | ORAL | 1 refills | Status: DC

## 2019-07-17 MED ORDER — BENAZEPRIL HCL 40 MG PO TABS: 40.0000 mg | ORAL_TABLET | Freq: Every day | ORAL | 1 refills | Status: DC

## 2019-07-17 NOTE — Telephone Encounter (Signed)
To be honest I can't tell. Probably the messages are doing weird things since the upgrade.  I am trying to get some of it fixed now.  Sorry

## 2019-07-17 NOTE — Telephone Encounter (Signed)
Yes. I can take him.

## 2019-07-17 NOTE — Telephone Encounter (Signed)
?     No phone note attached and patient sees Peter [name], [facility] currently. Actually saw her yesterday. Was this sent to wrong office?

## 2019-07-17 NOTE — Telephone Encounter (Signed)
Ok no worries.  Sorry you are having to deal with that.

## 2019-07-17 NOTE — Telephone Encounter (Signed)
This must have been one of the CRM conversions that messed up this morning by deleting the message.  It was originally intended for you as a request to take him as a patient. The First 2 CRMs that were sent did not include the message for some reason.  Now it is fixed.  Sorry   Copied from [employer] [id number]. Topic: General - Other >> Jul 17, 2019  3:23 PM Lyn Hollingshead D wrote: Reason for CRM: Pts would like to see Victorino Dike Antony Contras) Rosezetta Schlatter PA-C / his wife is her PT / lease advise

## 2019-07-18 NOTE — Telephone Encounter (Signed)
Please call patient and schedule new patient next available for [facility]

## 2019-07-23 NOTE — Assessment & Plan Note (Signed)
Stable and well controlled, continue current regimen 

## 2019-07-23 NOTE — Assessment & Plan Note (Signed)
BPs stable and WNL, continue current regimen 

## 2019-07-23 NOTE — Assessment & Plan Note (Signed)
Discussed at length with patient that unfortunately I am unable to continue bridging him further on tramadol, referred to our discussion back in March. He states he will start searching for a new PCP to see about getting it sorted. Discussed to let me know if he changes his mind on going to the pain clinic, and that I'd be happy to continue managing his other chronic conditions if so. Pt expresses gratitude for efforts.

## 2019-07-23 NOTE — Assessment & Plan Note (Signed)
Recheck lipids, adjust as needed. Continue current regimen 

## 2019-07-24 ENCOUNTER — Ambulatory Visit: Payer: BC Managed Care – PPO | Admitting: Family Medicine

## 2019-08-14 ENCOUNTER — Ambulatory Visit: Payer: BC Managed Care – PPO | Admitting: Physician Assistant

## 2019-08-14 ENCOUNTER — Encounter: Payer: Self-pay | Admitting: Physician Assistant

## 2019-08-14 ENCOUNTER — Other Ambulatory Visit: Payer: Self-pay

## 2019-08-14 VITALS — BP 135/89 | HR 89 | Temp 97.2°F | Resp 16 | Ht 72.0 in | Wt 215.2 lb

## 2019-08-14 DIAGNOSIS — I10 Essential (primary) hypertension: Secondary | ICD-10-CM | POA: Diagnosis not present

## 2019-08-14 DIAGNOSIS — Z6829 Body mass index (BMI) 29.0-29.9, adult: Secondary | ICD-10-CM

## 2019-08-14 DIAGNOSIS — E7800 Pure hypercholesterolemia, unspecified: Secondary | ICD-10-CM

## 2019-08-14 DIAGNOSIS — L0390 Cellulitis, unspecified: Secondary | ICD-10-CM

## 2019-08-14 DIAGNOSIS — K219 Gastro-esophageal reflux disease without esophagitis: Secondary | ICD-10-CM | POA: Diagnosis not present

## 2019-08-14 DIAGNOSIS — M545 Low back pain: Secondary | ICD-10-CM

## 2019-08-14 DIAGNOSIS — G8929 Other chronic pain: Secondary | ICD-10-CM

## 2019-08-14 DIAGNOSIS — M25562 Pain in left knee: Secondary | ICD-10-CM

## 2019-08-14 DIAGNOSIS — M25561 Pain in right knee: Secondary | ICD-10-CM

## 2019-08-14 MED ORDER — TRAMADOL HCL 50 MG PO TABS: 50.0000 mg | ORAL_TABLET | Freq: Four times a day (QID) | ORAL | 5 refills | Status: DC | PRN

## 2019-08-14 MED ORDER — MUPIROCIN 2 % EX OINT: 1.0000 "application " | TOPICAL_OINTMENT | Freq: Two times a day (BID) | CUTANEOUS | 1 refills | Status: DC

## 2019-08-14 NOTE — Assessment & Plan Note (Signed)
Tramadol refilled. NCCSR reviewed. No red flags. Last refill from 06/2019 still has about 50-60 tabs left in bottle. Only take when needed.
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
## 2019-08-14 NOTE — Assessment & Plan Note (Signed)
Will continue current treatment medication Tramadol 50mg .

## 2019-08-14 NOTE — Assessment & Plan Note (Signed)
Stable on Omeprazole 20mg  BID.

## 2019-08-14 NOTE — Assessment & Plan Note (Signed)
Stable on Benazepril 40mg .

## 2019-08-14 NOTE — Assessment & Plan Note (Signed)
Stable on Rosuvastatin 10mg .

## 2019-09-07 ENCOUNTER — Emergency Department
Admission: EM | Admit: 2019-09-07 | Discharge: 2019-09-07 | Disposition: A | Discharge status: Home / Self Care | Payer: BC Managed Care – PPO | Attending: Emergency Medicine | Admitting: Emergency Medicine

## 2019-09-07 ENCOUNTER — Other Ambulatory Visit: Payer: Self-pay

## 2019-09-07 DIAGNOSIS — K047 Periapical abscess without sinus: Secondary | ICD-10-CM | POA: Diagnosis not present

## 2019-09-07 DIAGNOSIS — I10 Essential (primary) hypertension: Secondary | ICD-10-CM | POA: Insufficient documentation

## 2019-09-07 DIAGNOSIS — K0889 Other specified disorders of teeth and supporting structures: Secondary | ICD-10-CM | POA: Diagnosis not present

## 2019-09-07 DIAGNOSIS — Z7982 Long term (current) use of aspirin: Secondary | ICD-10-CM | POA: Insufficient documentation

## 2019-09-07 MED ORDER — AMOXICILLIN 875 MG PO TABS: 875.0000 mg | ORAL_TABLET | Freq: Two times a day (BID) | ORAL | 0 refills | Status: DC
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
## 2019-09-07 NOTE — ED Triage Notes (Signed)
C/o pain to right upper tooth. No fever. Unlabored. VSS. Would like abx prescription.

## 2019-11-21 ENCOUNTER — Ambulatory Visit (INDEPENDENT_AMBULATORY_CARE_PROVIDER_SITE_OTHER): Payer: BC Managed Care – PPO

## 2019-11-21 ENCOUNTER — Other Ambulatory Visit: Payer: Self-pay

## 2019-11-21 DIAGNOSIS — Z23 Encounter for immunization: Secondary | ICD-10-CM | POA: Diagnosis not present

## 2019-12-02 IMAGING — CR DG HIP (WITH OR WITHOUT PELVIS) 2-3V*R*
3 series · 3 of 3 positions shown · non-contrast
Comparison: 01/05/2018

CLINICAL DATA: Back and right hip pain

EXAM:
DG HIP (WITH OR WITHOUT PELVIS) 2-3V RIGHT

[hip ap]
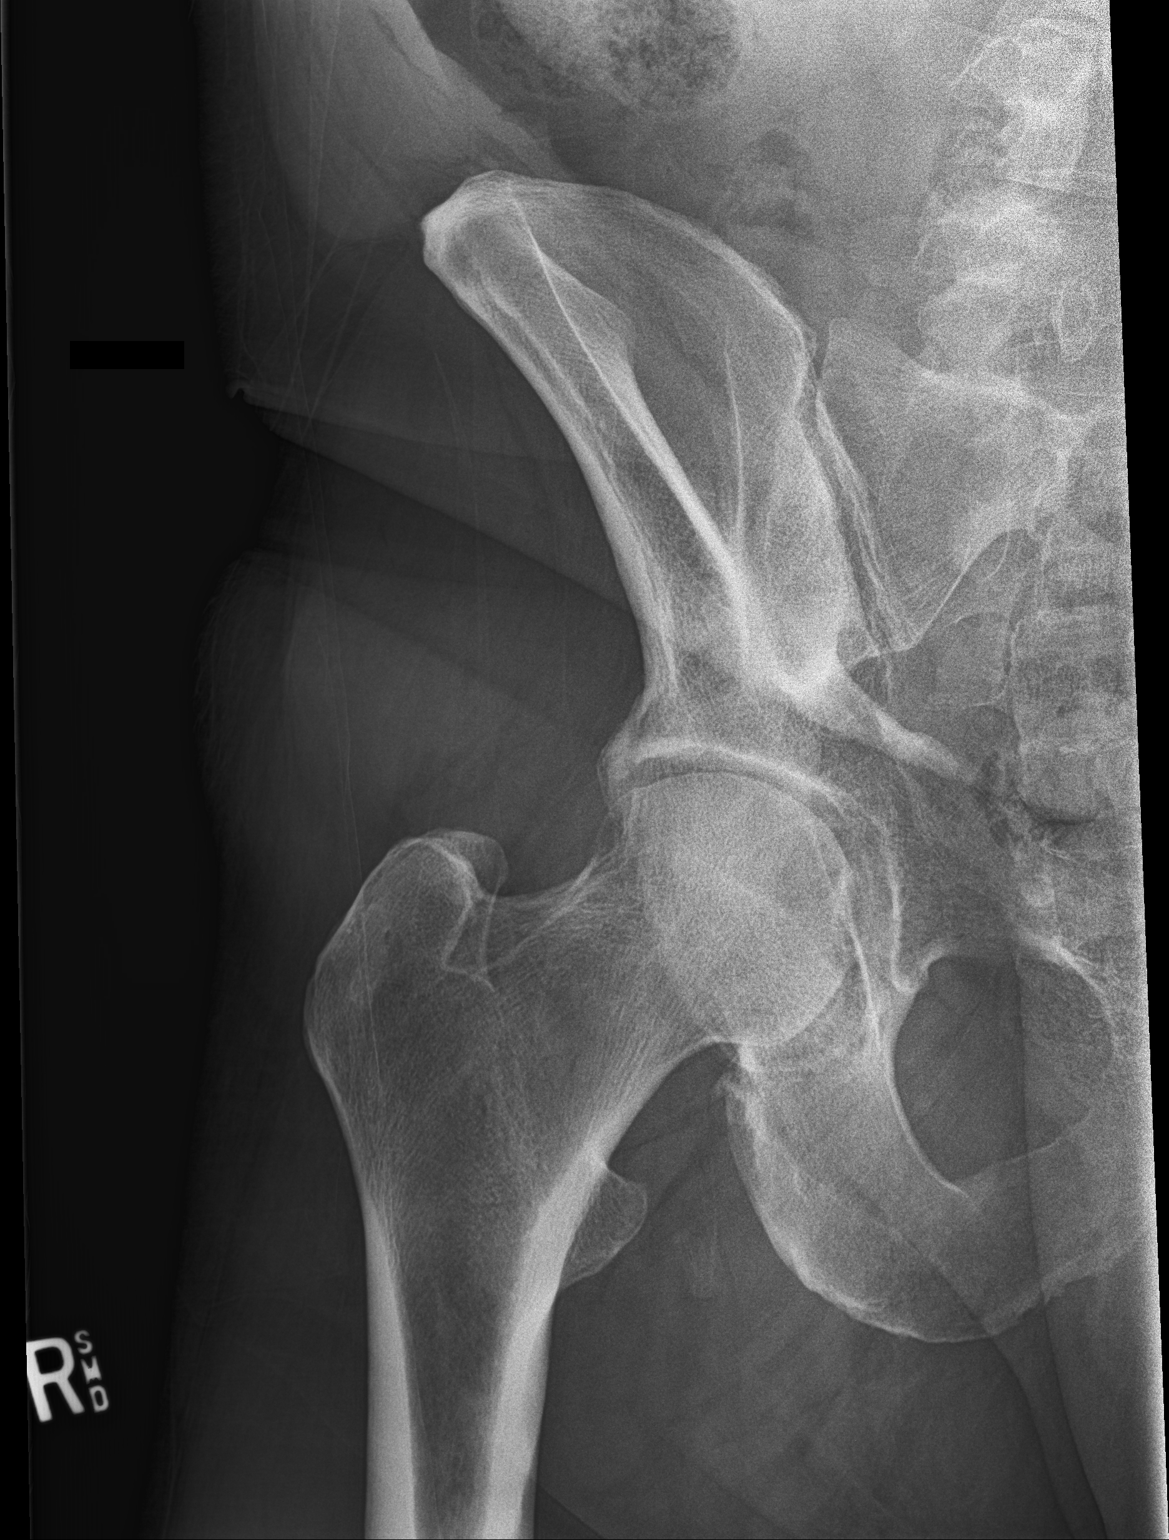

[hip lat]
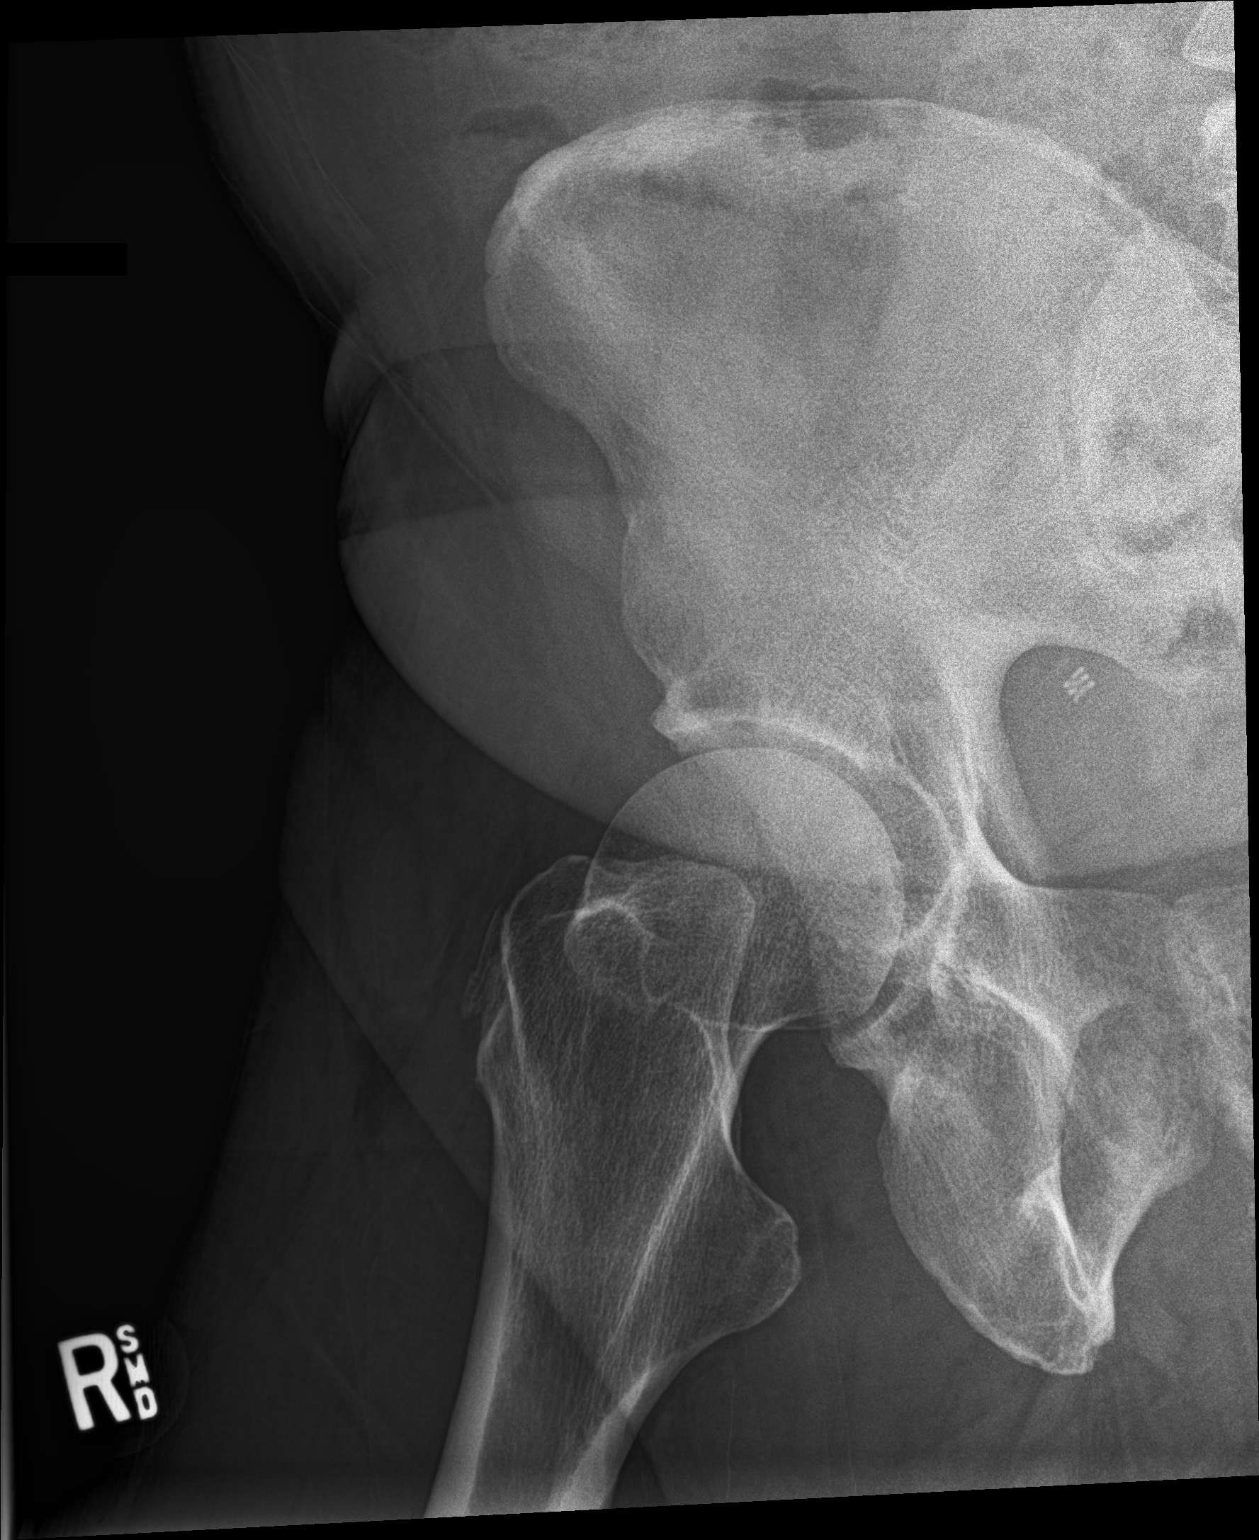

[pelvis ap]
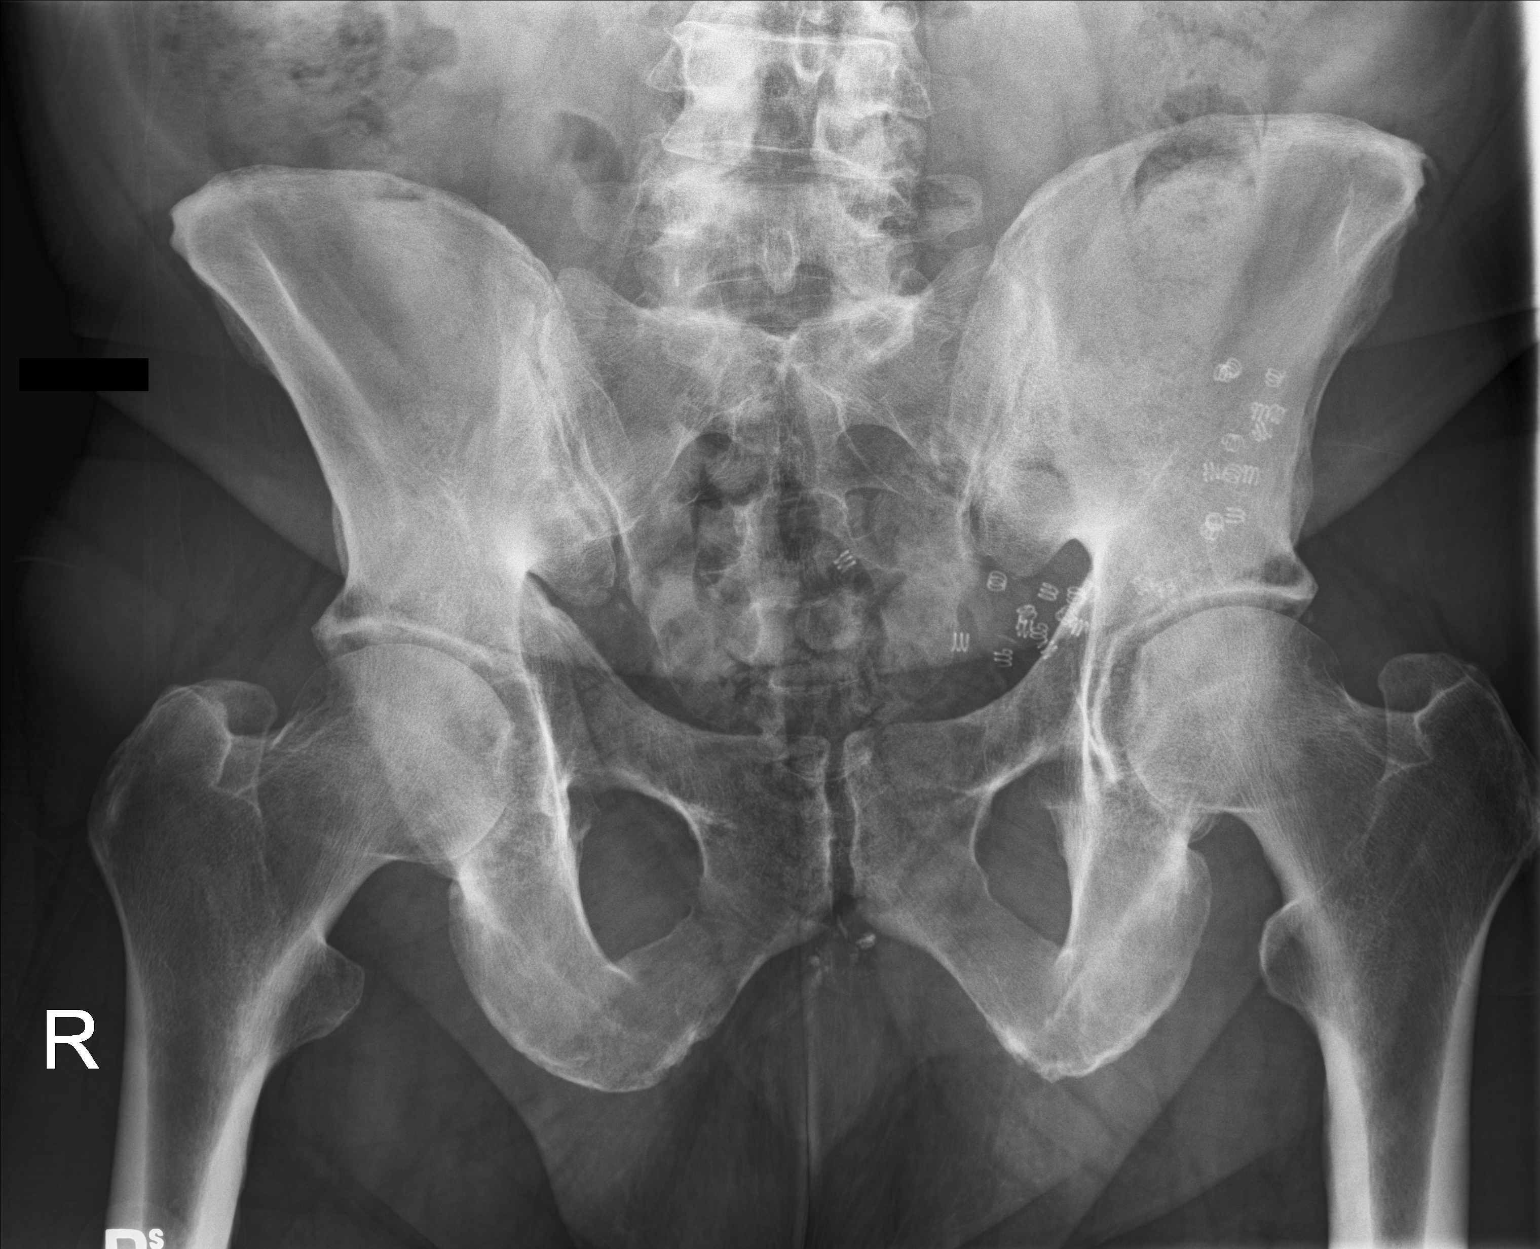

[3 of 3 positions shown; findings below may reference images not displayed]

FINDINGS: Right hip appears intact. No acute fracture or malalignment. Bony
pelvis and hips appear symmetric. Postop changes in the left lower
quadrant.
IMPRESSION: No acute osseous finding.

## 2019-12-02 IMAGING — CR DG LUMBAR SPINE COMPLETE 4+V
4 series · 4 of 4 positions shown · non-contrast
Comparison: 01/23/2006 CT, 01/05/2018

CLINICAL DATA: Low back pain radiating into the right hip and leg

EXAM:
LUMBAR SPINE - COMPLETE 4+ VIEW

[l-spine ap]
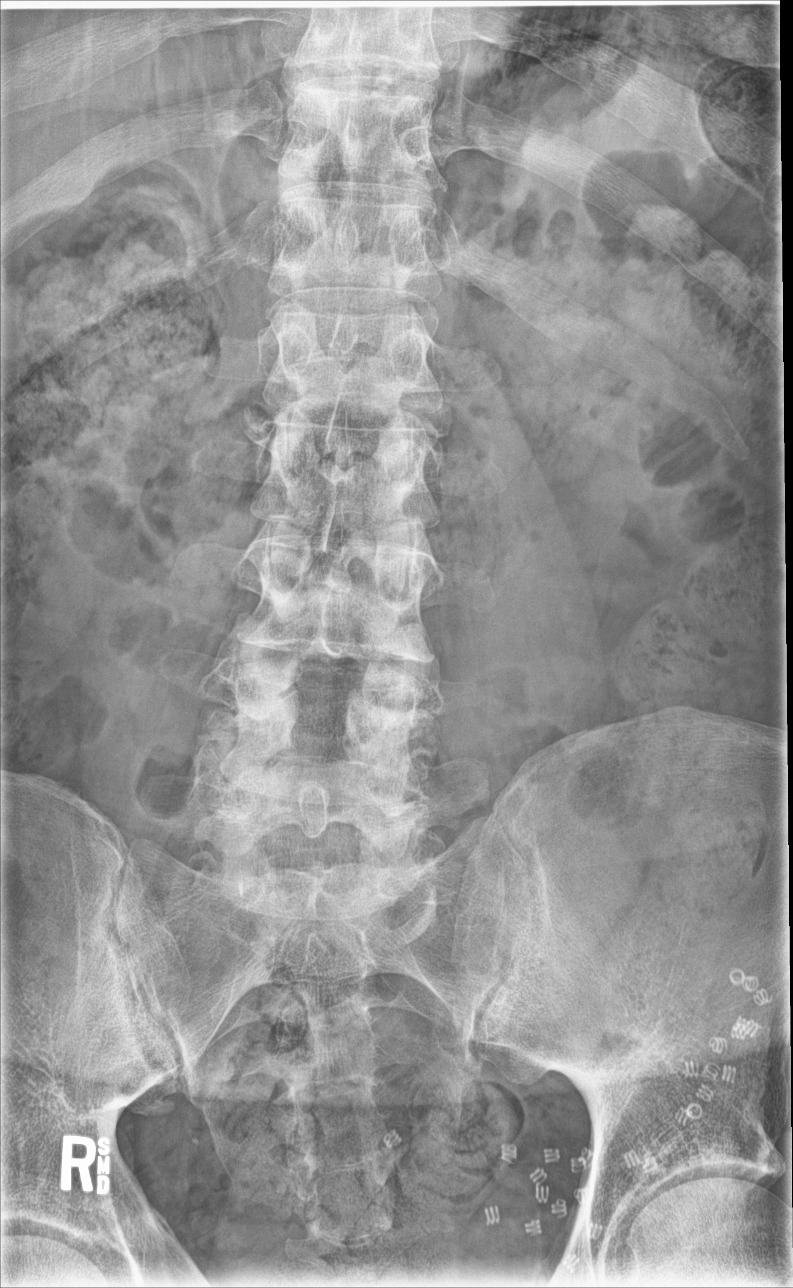

[l-spine obl (1 of 2)]
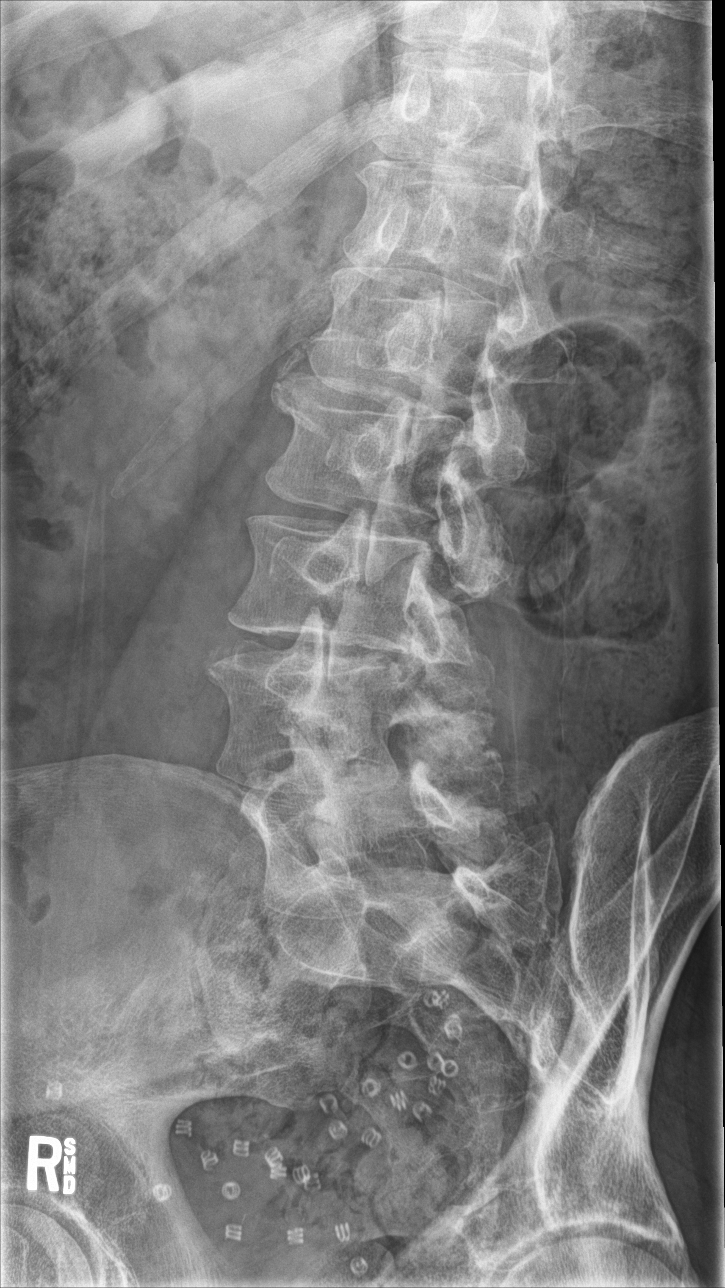

[l-spine obl (2 of 2)]
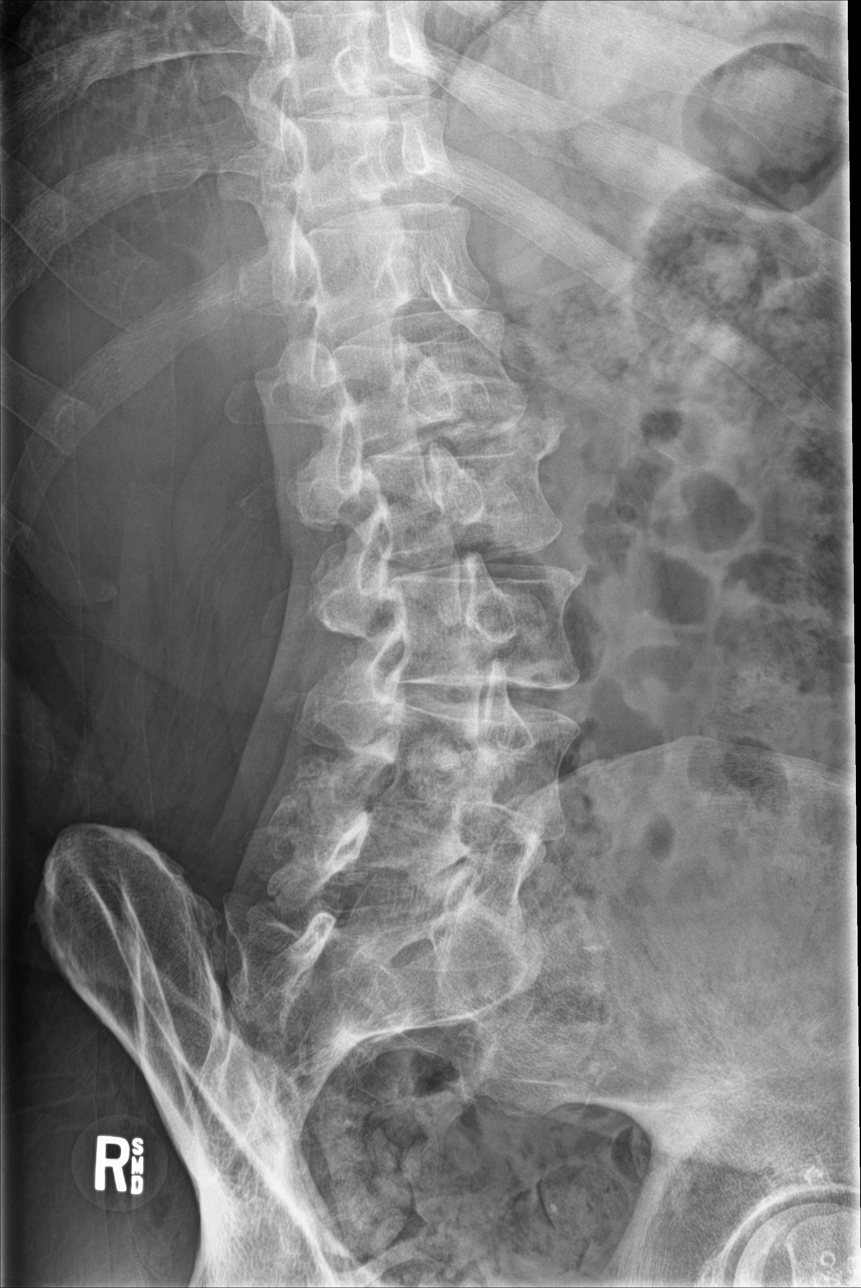

[l-spine lat]
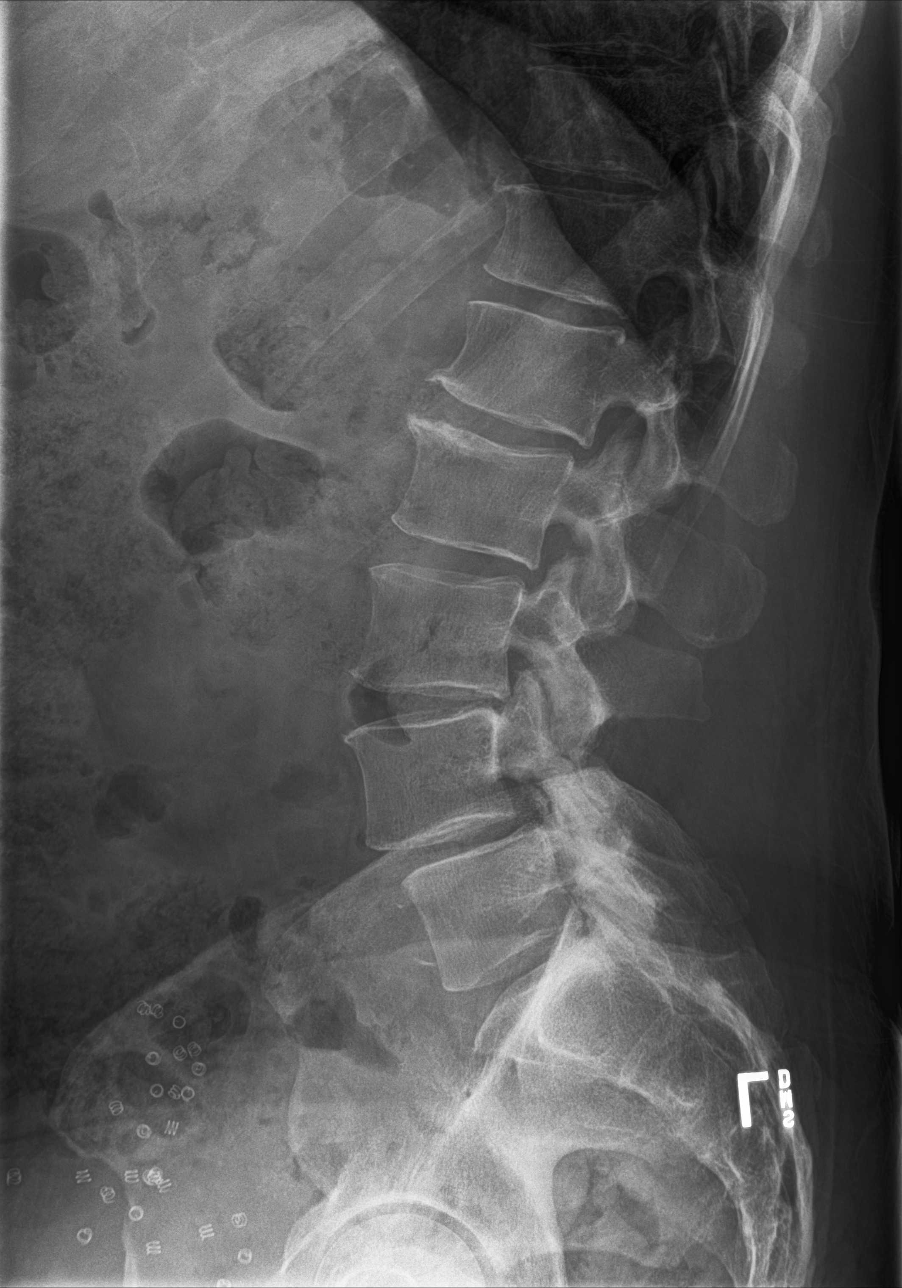

[4 of 4 positions shown; findings below may reference images not displayed]

FINDINGS: Preserved vertebral body heights. No acute compression fracture,
wedge-shaped deformity or focal kyphosis. Grade 1 anterolisthesis of
L4 upon L5 measuring 5 mm appearing secondary to facet arthropathy.
L4 laminectomies noted. No definite pars defects. Minor SI joint
arthropathy bilaterally. Postop changes in the left lower quadrant.
Nonobstructive bowel gas pattern. Moderate colonic stool burden
noted.
IMPRESSION: Degenerative changes and postoperative findings as above. No acute
finding by plain radiography

## 2019-12-03 IMAGING — DX DG TIBIA/FIBULA 2V*R*
4 series · 4 of 4 positions shown · non-contrast
Comparison: None.

CLINICAL DATA: Lower leg pain for 2-3 days.

EXAM:
RIGHT TIBIA AND FIBULA - 2 VIEW

[tibia ap (1 of 2)]
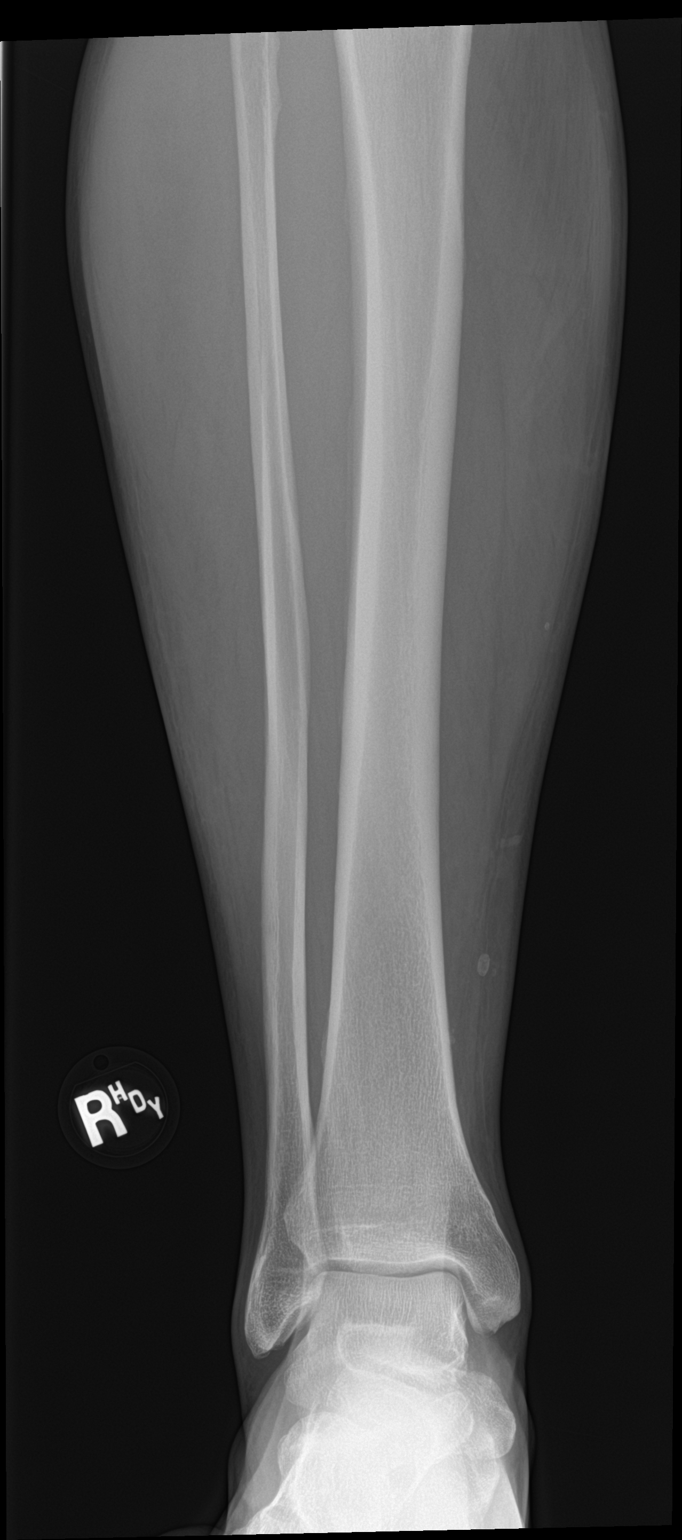

[tibia ap (2 of 2)]
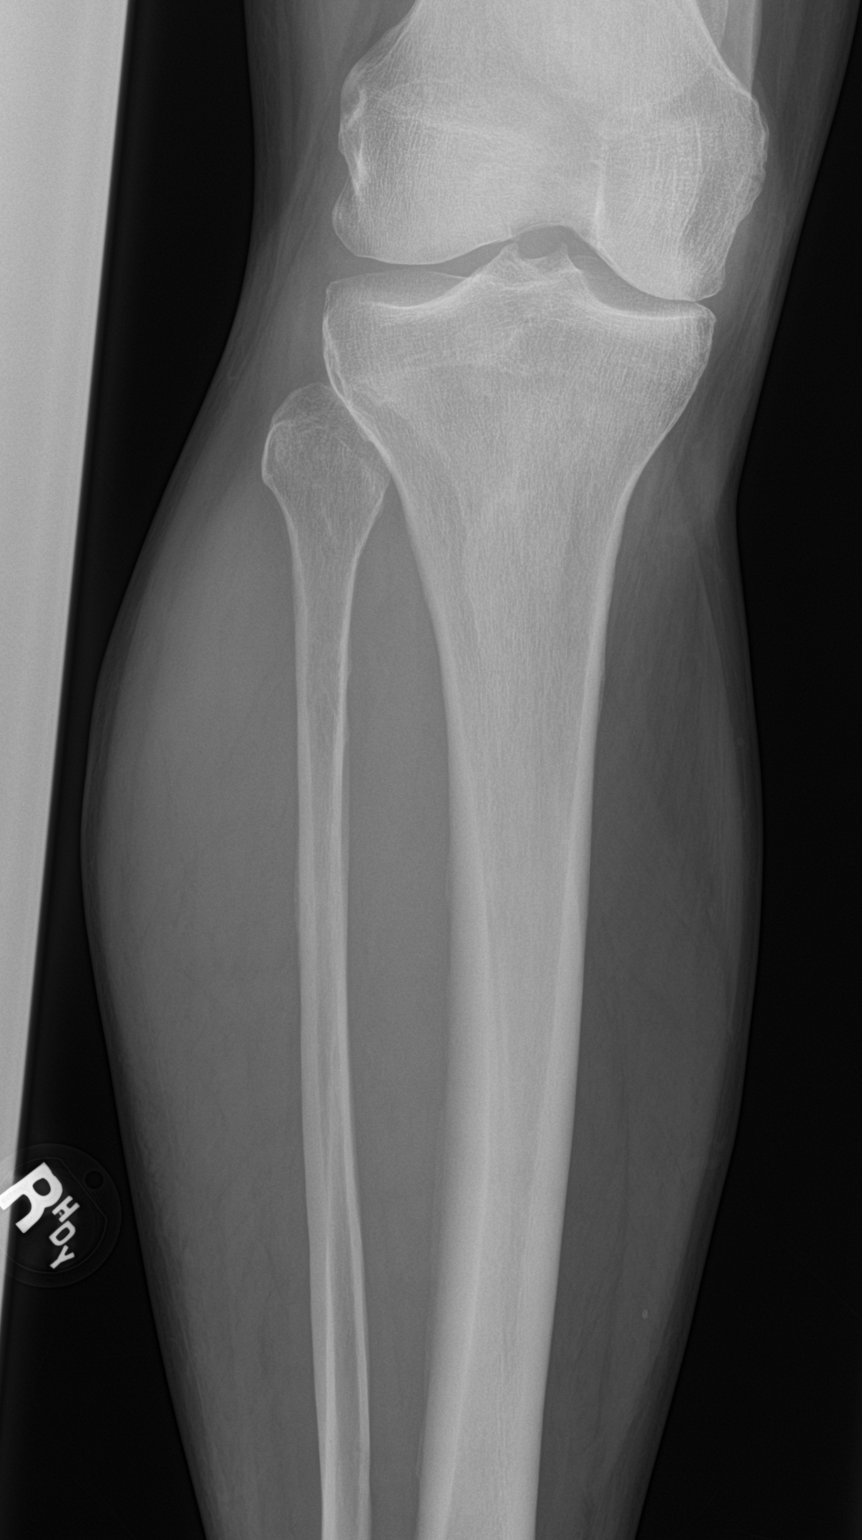

[tibia lat (1 of 2)]
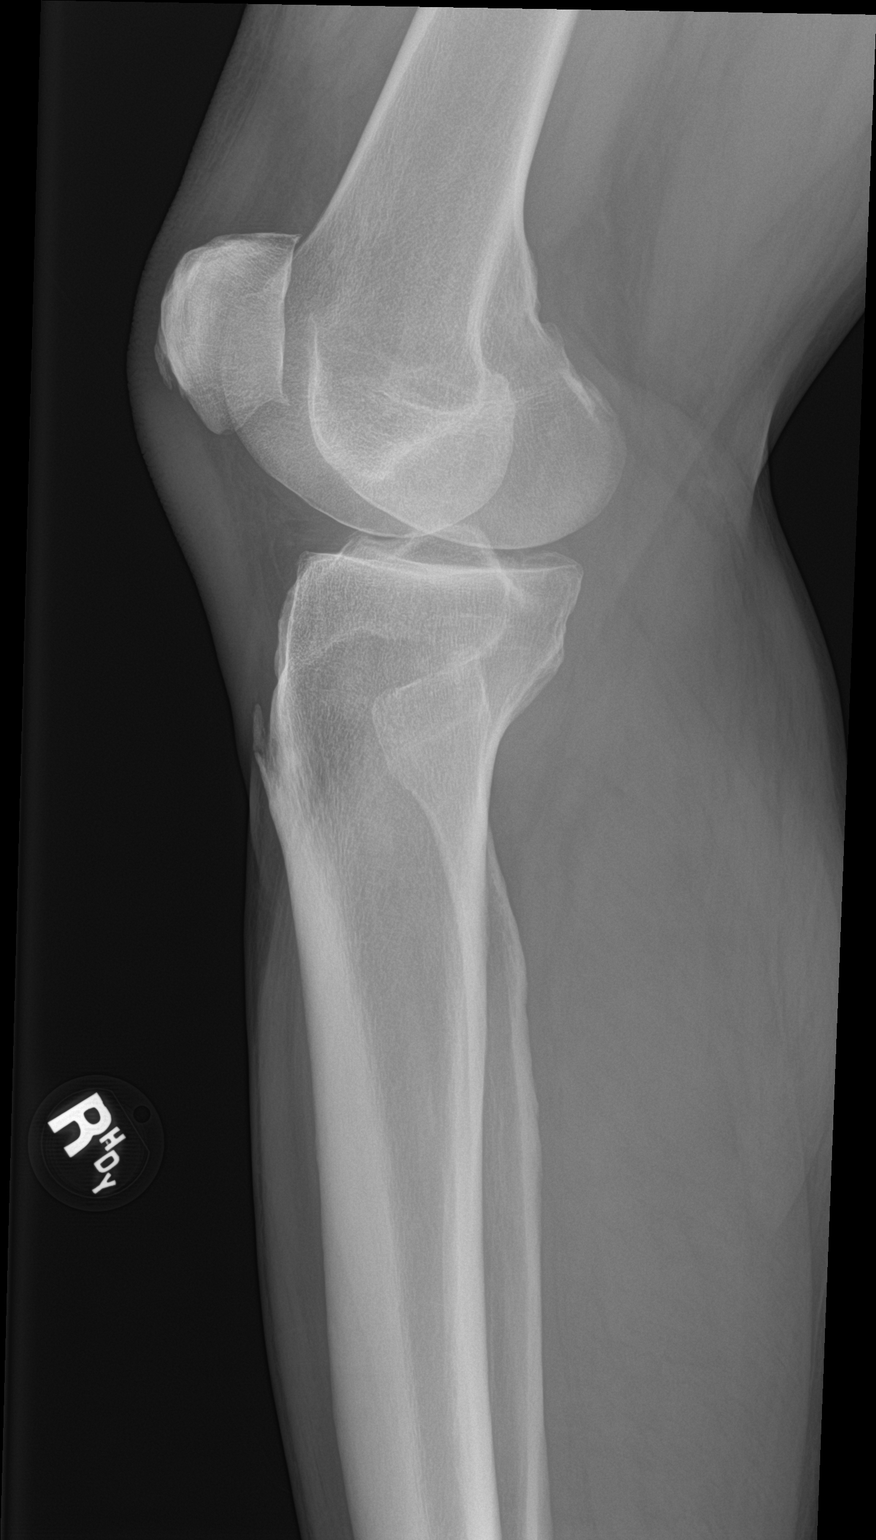

[tibia lat (2 of 2)]
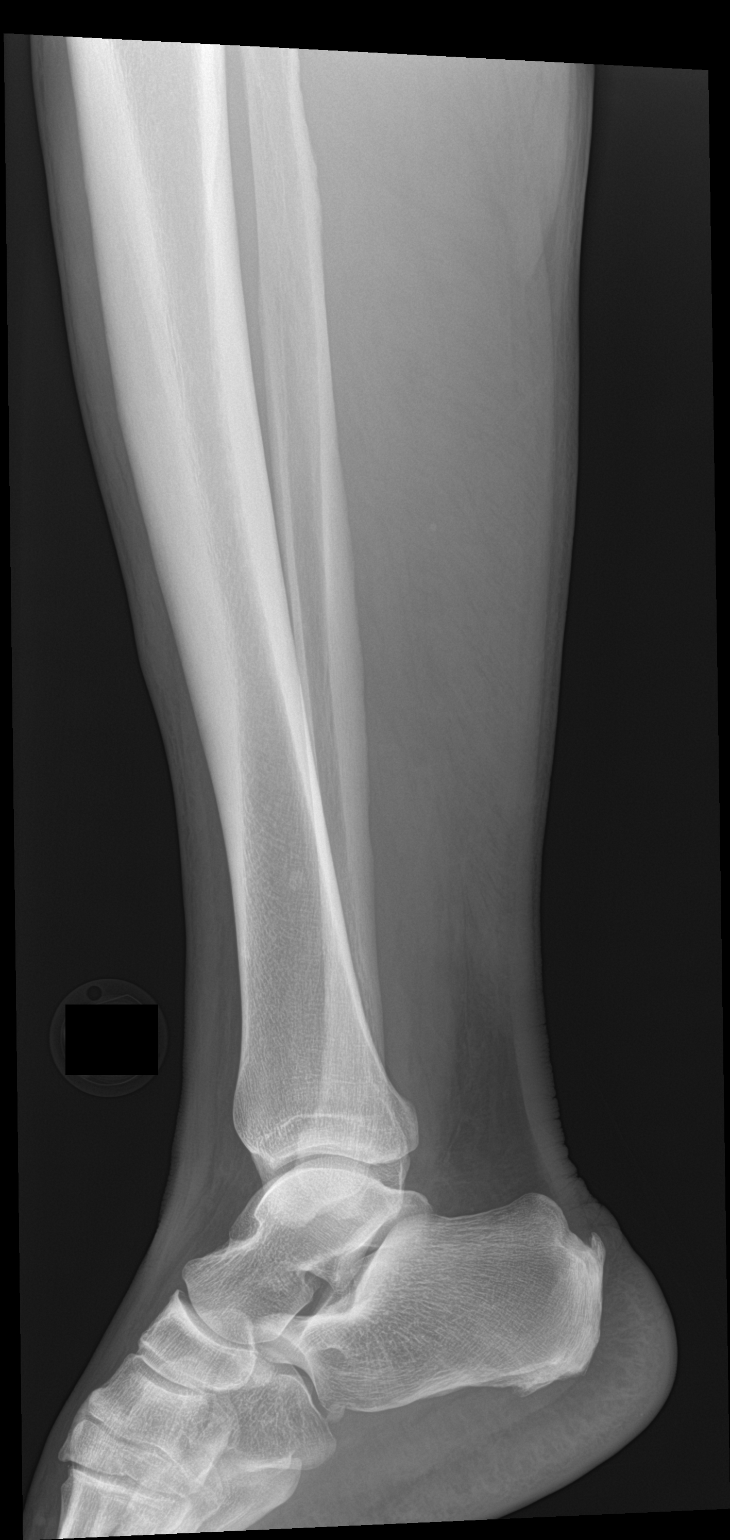

[4 of 4 positions shown; findings below may reference images not displayed]

FINDINGS: There is no evidence of fracture or other focal bone lesions. Soft
tissues are unremarkable.
IMPRESSION: Negative.

## 2020-01-17 DIAGNOSIS — R55 Syncope and collapse: Secondary | ICD-10-CM | POA: Diagnosis not present

## 2020-01-17 DIAGNOSIS — I447 Left bundle-branch block, unspecified: Secondary | ICD-10-CM | POA: Diagnosis not present

## 2020-01-17 DIAGNOSIS — I4510 Unspecified right bundle-branch block: Secondary | ICD-10-CM | POA: Diagnosis not present

## 2020-01-17 DIAGNOSIS — R569 Unspecified convulsions: Secondary | ICD-10-CM | POA: Diagnosis not present

## 2020-01-17 DIAGNOSIS — I9589 Other hypotension: Secondary | ICD-10-CM | POA: Diagnosis not present

## 2020-01-17 DIAGNOSIS — I10 Essential (primary) hypertension: Secondary | ICD-10-CM | POA: Diagnosis not present

## 2020-01-17 DIAGNOSIS — T68XXXA Hypothermia, initial encounter: Secondary | ICD-10-CM | POA: Diagnosis not present

## 2020-01-17 DIAGNOSIS — E860 Dehydration: Secondary | ICD-10-CM | POA: Diagnosis not present

## 2020-01-17 DIAGNOSIS — I4589 Other specified conduction disorders: Secondary | ICD-10-CM | POA: Diagnosis not present

## 2020-01-17 DIAGNOSIS — R0902 Hypoxemia: Secondary | ICD-10-CM | POA: Diagnosis not present

## 2020-01-17 DIAGNOSIS — R197 Diarrhea, unspecified: Secondary | ICD-10-CM | POA: Diagnosis not present

## 2020-01-17 DIAGNOSIS — Z20822 Contact with and (suspected) exposure to covid-19: Secondary | ICD-10-CM | POA: Diagnosis not present

## 2020-01-17 DIAGNOSIS — K219 Gastro-esophageal reflux disease without esophagitis: Secondary | ICD-10-CM | POA: Diagnosis not present

## 2020-01-17 DIAGNOSIS — I440 Atrioventricular block, first degree: Secondary | ICD-10-CM | POA: Diagnosis not present

## 2020-01-19 ENCOUNTER — Ambulatory Visit (INDEPENDENT_AMBULATORY_CARE_PROVIDER_SITE_OTHER): Payer: BC Managed Care – PPO | Admitting: Physician Assistant

## 2020-01-19 ENCOUNTER — Encounter: Payer: Self-pay | Admitting: Physician Assistant

## 2020-01-19 VITALS — BP 100/71

## 2020-01-19 DIAGNOSIS — R197 Diarrhea, unspecified: Secondary | ICD-10-CM

## 2020-01-19 DIAGNOSIS — E860 Dehydration: Secondary | ICD-10-CM

## 2020-01-19 DIAGNOSIS — N410 Acute prostatitis: Secondary | ICD-10-CM

## 2020-01-19 MED ORDER — SULFAMETHOXAZOLE-TRIMETHOPRIM 800-160 MG PO TABS: 1.0000 | ORAL_TABLET | Freq: Two times a day (BID) | ORAL | 0 refills | Status: DC
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
## 2020-01-22 ENCOUNTER — Ambulatory Visit: Payer: Self-pay | Admitting: *Deleted

## 2020-01-22 NOTE — Telephone Encounter (Signed)
Please advise 

## 2020-01-22 NOTE — Telephone Encounter (Signed)
Pt called in c/o continued watery, runny very smelly diarrhea.   He spoke with Joycelyn Man, PA-C on Monday via a telephone visit and was prescribed an antibiotic which he is taking but the diarrhea "is terrible".  He is drinking plenty of fluids which I encouraged him to continue doing.   He had several tests done at [facility] ED on Saturday when he went there after passing out in the mall.   "They didn't tell me anything except my heart was fine".   "They tested me for c-diff but I've heard nothing from them regarding any of my tests".  "I'm kinda upset about that".  I let him know I would send a high priority note to Joycelyn Man letting her know of his continuing diarrhea so when the office opens at 8:00 someone can give him a call.  The phone number in this chart is correct.  I went over the importance of washing his hands after using the restroom in case it is C-diff.  He thanked me very much for my help.  I sent my notes to [facility] for Joycelyn Man, PA-C for someone to contact him after the office opens.  I instructed him to return to the ED if he passed out or started having bloody stools.   He verbalized understanding and was agreeable to that plan.   Reason for Disposition . [1] SEVERE diarrhea (e.g., 7 or more times / day more than normal) AND [2] age > 60 years  Answer Assessment - Initial Assessment Questions 1. DIARRHEA SEVERITY: "How bad is the diarrhea?" "How many extra stools have you had in the past 24 hours than normal?"    - NO DIARRHEA (SCALE 0)   - MILD (SCALE 1-3): Few loose or mushy BMs; increase of 1-3 stools over normal daily number of stools; mild increase in ostomy output.   -  MODERATE (SCALE 4-7): Increase of 4-6 stools daily over normal; moderate increase in ostomy output. * SEVERE (SCALE 8-10; OR 'WORST POSSIBLE'): Increase of 7 or more stools daily over normal; moderate increase in ostomy output; incontinence.     Diarrhea since  yesterday.    I passed out at the mall Saturday.   Victorino Dike has seen me since then.   I have a physical set up on Monday.   I talked with Victorino Dike on Monday 2. ONSET: "When did the diarrhea begin?"      Runny diarrhea.   Not bloody. 3. BM CONSISTENCY: "How loose or watery is the diarrhea?"      Very stinky.  I'm on an antibiotic for the diarrhea.   I think I have prostititus.   I've had it before and I'm urinating a lot.  No burning with urinating.  I'm drinking Power Aid for hydration.   They gave me a lot of IV fluids in the ED. 4. VOMITING: "Are you also vomiting?" If Yes, ask: "How many times in the past 24 hours?"      No 5. ABDOMINAL PAIN: "Are you having any abdominal pain?" If Yes, ask: "What does it feel like?" (e.g., crampy, dull, intermittent, constant)      Cramps once in a while. 6. ABDOMINAL PAIN SEVERITY: If present, ask: "How bad is the pain?"  (e.g., Scale 1-10; mild, moderate, or severe)   - MILD (1-3): doesn't interfere with normal activities, abdomen soft and not tender to touch    - MODERATE (4-7): interferes with normal activities or awakens from sleep, tender to  touch    - SEVERE (8-10): excruciating pain, doubled over, unable to do any normal activities       Mild cramps occasionally 7. ORAL INTAKE: If vomiting, "Have you been able to drink liquids?" "How much fluids have you had in the past 24 hours?"     I'm drinking a lot of fluids.   I've been nibbling for the past couple of days. I've taken some anti diarrhea medicine this morning. 8. HYDRATION: "Any signs of dehydration?" (e.g., dry mouth [not just dry lips], too weak to stand, dizziness, new weight loss) "When did you last urinate?"     I'm not dizzy but weaker than normal. 9. EXPOSURE: "Have you traveled to a foreign country recently?" "Have you been exposed to anyone with diarrhea?" "Could you have eaten any food that was spoiled?"     No 10. ANTIBIOTIC USE: "Are you taking antibiotics now or have you taken  antibiotics in the past 2 months?"       Yes for the diarrhea. 11. OTHER SYMPTOMS: "Do you have any other symptoms?" (e.g., fever, blood in stool)       No blood 12. PREGNANCY: "Is there any chance you are pregnant?" "When was your last menstrual period?"       N/A  Protocols used: DIARRHEA-A-AH

## 2020-01-22 NOTE — Telephone Encounter (Signed)
Patient reports he is feeling much better. Reports still has diarrhea tolerating food and liquids well. Will keep appt on 01/26/2020. Patient advised to go to the ER or urgent care if symptoms are worsening.

## 2020-01-22 NOTE — Telephone Encounter (Signed)
Can see if anyone has openings. I do not, but one of the PAs may

## 2020-01-26 ENCOUNTER — Ambulatory Visit: Payer: BC Managed Care – PPO | Admitting: Physician Assistant

## 2020-01-26 ENCOUNTER — Other Ambulatory Visit: Payer: Self-pay

## 2020-01-26 ENCOUNTER — Encounter: Payer: Self-pay | Admitting: Physician Assistant

## 2020-01-26 VITALS — BP 120/88 | HR 91 | Temp 97.7°F | Resp 16 | Ht 72.0 in | Wt 215.0 lb

## 2020-01-26 DIAGNOSIS — F419 Anxiety disorder, unspecified: Secondary | ICD-10-CM | POA: Diagnosis not present

## 2020-01-26 DIAGNOSIS — R197 Diarrhea, unspecified: Secondary | ICD-10-CM

## 2020-01-26 DIAGNOSIS — R972 Elevated prostate specific antigen [PSA]: Secondary | ICD-10-CM | POA: Diagnosis not present

## 2020-01-26 DIAGNOSIS — N5203 Combined arterial insufficiency and corporo-venous occlusive erectile dysfunction: Secondary | ICD-10-CM | POA: Diagnosis not present

## 2020-01-26 DIAGNOSIS — I10 Essential (primary) hypertension: Secondary | ICD-10-CM | POA: Diagnosis not present

## 2020-01-26 DIAGNOSIS — Z0000 Encounter for general adult medical examination without abnormal findings: Secondary | ICD-10-CM | POA: Diagnosis not present

## 2020-01-26 DIAGNOSIS — E7800 Pure hypercholesterolemia, unspecified: Secondary | ICD-10-CM

## 2020-01-26 DIAGNOSIS — M25561 Pain in right knee: Secondary | ICD-10-CM

## 2020-01-26 DIAGNOSIS — G8929 Other chronic pain: Secondary | ICD-10-CM

## 2020-01-26 MED ORDER — TRAMADOL HCL 50 MG PO TABS
50.0000 mg | ORAL_TABLET | Freq: Four times a day (QID) | ORAL | 5 refills | Status: DC | PRN
Start: 2020-01-26 — End: 2020-05-31

## 2020-01-26 MED ORDER — SERTRALINE HCL 50 MG PO TABS: ORAL_TABLET | ORAL | 3 refills | Status: DC

## 2020-01-26 MED ORDER — TADALAFIL 5 MG PO TABS: 5.0000 mg | ORAL_TABLET | Freq: Every day | ORAL | 5 refills | Status: DC

## 2020-01-26 MED ORDER — TAMSULOSIN HCL 0.4 MG PO CAPS: 0.4000 mg | ORAL_CAPSULE | Freq: Every day | ORAL | 3 refills | Status: DC

## 2020-01-26 NOTE — Patient Instructions (Signed)
Preventive Care 41-62 Years Old, Male Preventive care refers to lifestyle choices and visits with your health care provider that can promote health and wellness. This includes:  A yearly physical exam. This is also called an annual well check.  Regular dental and eye exams.  Immunizations.  Screening for certain conditions.  Healthy lifestyle choices, such as eating a healthy diet, getting regular exercise, not using drugs or products that contain nicotine and tobacco, and limiting alcohol use. What can I expect for my preventive care visit? Physical exam Your health care provider will check:  Height and weight. These may be used to calculate body mass index (BMI), which is a measurement that tells if you are at a healthy weight.  Heart rate and blood pressure.  Your skin for abnormal spots. Counseling Your health care provider may ask you questions about:  Alcohol, tobacco, and drug use.  Emotional well-being.  Home and relationship well-being.  Sexual activity.  Eating habits.  Work and work Statistician. What immunizations do I need?  Influenza (flu) vaccine  This is recommended every year. Tetanus, diphtheria, and pertussis (Tdap) vaccine  You may need a Td booster every 10 years. Varicella (chickenpox) vaccine  You may need this vaccine if you have not already been vaccinated. Zoster (shingles) vaccine  You may need this after age 64. Measles, mumps, and rubella (MMR) vaccine  You may need at least one dose of MMR if you were born in 1957 or later. You may also need a second dose. Pneumococcal conjugate (PCV13) vaccine  You may need this if you have certain conditions and were not previously vaccinated. Pneumococcal polysaccharide (PPSV23) vaccine  You may need one or two doses if you smoke cigarettes or if you have certain conditions. Meningococcal conjugate (MenACWY) vaccine  You may need this if you have certain conditions. Hepatitis A  vaccine  You may need this if you have certain conditions or if you travel or work in places where you may be exposed to hepatitis A. Hepatitis B vaccine  You may need this if you have certain conditions or if you travel or work in places where you may be exposed to hepatitis B. Haemophilus influenzae type b (Hib) vaccine  You may need this if you have certain risk factors. Human papillomavirus (HPV) vaccine  If recommended by your health care provider, you may need three doses over 6 months. You may receive vaccines as individual doses or as more than one vaccine together in one shot (combination vaccines). Talk with your health care provider about the risks and benefits of combination vaccines. What tests do I need? Blood tests  Lipid and cholesterol levels. These may be checked every 5 years, or more frequently if you are over 60 years old.  Hepatitis C test.  Hepatitis B test. Screening  Lung cancer screening. You may have this screening every year starting at age 43 if you have a 30-pack-year history of smoking and currently smoke or have quit within the past 15 years.  Prostate cancer screening. Recommendations will vary depending on your family history and other risks.  Colorectal cancer screening. All adults should have this screening starting at age 72 and continuing until age 2. Your health care provider may recommend screening at age 14 if you are at increased risk. You will have tests every 1-10 years, depending on your results and the type of screening test.  Diabetes screening. This is done by checking your blood sugar (glucose) after you have not eaten  for a while (fasting). You may have this done every 1-3 years.  Sexually transmitted disease (STD) testing. Follow these instructions at home: Eating and drinking  Eat a diet that includes fresh fruits and vegetables, whole grains, lean protein, and low-fat dairy products.  Take vitamin and mineral supplements as  recommended by your health care provider.  Do not drink alcohol if your health care provider tells you not to drink.  If you drink alcohol: ? Limit how much you have to 0-2 drinks a day. ? Be aware of how much alcohol is in your drink. In the [place] one drink equals one 12 oz bottle of beer (355 mL), one 5 oz glass of wine (148 mL), or one 1 oz glass of hard liquor (44 mL). Lifestyle  Take daily care of your teeth and gums.  Stay active. Exercise for at least 30 minutes on 5 or more days each week.  Do not use any products that contain nicotine or tobacco, such as cigarettes, e-cigarettes, and chewing tobacco. If you need help quitting, ask your health care provider.  If you are sexually active, practice safe sex. Use a condom or other form of protection to prevent STIs (sexually transmitted infections).  Talk with your health care provider about taking a low-dose aspirin every day starting at age 53. What's next?  Go to your health care provider once a year for a well check visit.  Ask your health care provider how often you should have your eyes and teeth checked.  Stay up to date on all vaccines. This information is not intended to replace advice given to you by your health care provider. Make sure you discuss any questions you have with your health care provider. Document Revised: 01/17/2018 Document Reviewed: 01/17/2018 Elsevier Patient Education  2020 [employer].
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
## 2020-01-27 DIAGNOSIS — Z0000 Encounter for general adult medical examination without abnormal findings: Secondary | ICD-10-CM | POA: Diagnosis not present

## 2020-01-27 DIAGNOSIS — E7800 Pure hypercholesterolemia, unspecified: Secondary | ICD-10-CM | POA: Diagnosis not present

## 2020-01-27 DIAGNOSIS — R972 Elevated prostate specific antigen [PSA]: Secondary | ICD-10-CM | POA: Diagnosis not present

## 2020-01-27 DIAGNOSIS — I10 Essential (primary) hypertension: Secondary | ICD-10-CM | POA: Diagnosis not present

## 2020-01-28 LAB — CBC WITH DIFFERENTIAL/PLATELET
Basophils Absolute: 0.1 10*3/uL (ref 0.0–0.2)
Basos: 1 %
EOS (ABSOLUTE): 0.4 10*3/uL (ref 0.0–0.4)
Eos: 4 %
Hematocrit: 35 % — ABNORMAL LOW (ref 37.5–51.0)
Hemoglobin: 12.2 g/dL — ABNORMAL LOW (ref 13.0–17.7)
Immature Grans (Abs): 0.1 10*3/uL (ref 0.0–0.1)
Immature Granulocytes: 1 %
Lymphocytes Absolute: 1.5 10*3/uL (ref 0.7–3.1)
Lymphs: 18 %
MCH: 32.4 pg (ref 26.6–33.0)
MCHC: 34.9 g/dL (ref 31.5–35.7)
MCV: 93 fL (ref 79–97)
Monocytes Absolute: 0.6 10*3/uL (ref 0.1–0.9)
Monocytes: 8 %
Neutrophils Absolute: 5.8 10*3/uL (ref 1.4–7.0)
Neutrophils: 68 %
Platelets: 560 10*3/uL — ABNORMAL HIGH (ref 150–450)
RBC: 3.77 x10E6/uL — ABNORMAL LOW (ref 4.14–5.80)
RDW: 12.3 % (ref 11.6–15.4)
WBC: 8.4 10*3/uL (ref 3.4–10.8)

## 2020-01-28 LAB — COMPREHENSIVE METABOLIC PANEL
ALT: 14 IU/L (ref 0–44)
AST: 16 IU/L (ref 0–40)
Albumin/Globulin Ratio: 1.7 (ref 1.2–2.2)
Albumin: 4 g/dL (ref 3.8–4.8)
Alkaline Phosphatase: 61 IU/L (ref 44–121)
BUN/Creatinine Ratio: 14 (ref 10–24)
BUN: 23 mg/dL (ref 8–27)
Bilirubin Total: <0.2 mg/dL (ref 0.0–1.2)
CO2: 20 mmol/L (ref 20–29)
Calcium: 9.5 mg/dL (ref 8.6–10.2)
Chloride: 104 mmol/L (ref 96–106)
Creatinine, Ser: 1.61 mg/dL — ABNORMAL HIGH (ref 0.76–1.27)
GFR calc Af Amer: 52 mL/min/{1.73_m2} — ABNORMAL LOW (ref >59)
GFR calc non Af Amer: 45 mL/min/{1.73_m2} — ABNORMAL LOW (ref >59)
Globulin, Total: 2.3 g/dL (ref 1.5–4.5)
Glucose: 104 mg/dL — ABNORMAL HIGH (ref 65–99)
Potassium: 4.9 mmol/L (ref 3.5–5.2)
Sodium: 137 mmol/L (ref 134–144)
Total Protein: 6.3 g/dL (ref 6.0–8.5)

## 2020-01-28 LAB — LIPID PANEL
Chol/HDL Ratio: 5.7 ratio — ABNORMAL HIGH (ref 0.0–5.0)
Cholesterol, Total: 204 mg/dL — ABNORMAL HIGH (ref 100–199)
HDL: 36 mg/dL — ABNORMAL LOW (ref >39)
LDL Chol Calc (NIH): 135 mg/dL — ABNORMAL HIGH (ref 0–99)
Triglycerides: 185 mg/dL — ABNORMAL HIGH (ref 0–149)
VLDL Cholesterol Cal: 33 mg/dL (ref 5–40)

## 2020-01-28 LAB — HEMOGLOBIN A1C
Est. average glucose Bld gHb Est-mCnc: 111 mg/dL
Hgb A1c MFr Bld: 5.5 % (ref 4.8–5.6)

## 2020-01-28 LAB — TSH: TSH: 1.7 u[IU]/mL (ref 0.450–4.500)

## 2020-01-28 LAB — PSA: Prostate Specific Ag, Serum: 14.8 ng/mL — ABNORMAL HIGH (ref 0.0–4.0)

## 2020-02-02 ENCOUNTER — Telehealth: Payer: Self-pay

## 2020-02-02 ENCOUNTER — Other Ambulatory Visit: Payer: Self-pay | Admitting: Physician Assistant

## 2020-02-02 DIAGNOSIS — R972 Elevated prostate specific antigen [PSA]: Secondary | ICD-10-CM

## 2020-02-02 DIAGNOSIS — R7989 Other specified abnormal findings of blood chemistry: Secondary | ICD-10-CM

## 2020-02-02 NOTE — Progress Notes (Signed)
Labs ordered for recheck

## 2020-02-02 NOTE — Telephone Encounter (Signed)
Copied from CRM [id number]. Topic: General - Other >> Feb 02, 2020 11:03 AM Dalphine Handing A wrote: Patient would like a callback from [facility] nurse to go over lab results

## 2020-02-03 DIAGNOSIS — I10 Essential (primary) hypertension: Secondary | ICD-10-CM | POA: Diagnosis not present

## 2020-02-03 DIAGNOSIS — R972 Elevated prostate specific antigen [PSA]: Secondary | ICD-10-CM | POA: Diagnosis not present

## 2020-02-03 DIAGNOSIS — R7989 Other specified abnormal findings of blood chemistry: Secondary | ICD-10-CM | POA: Diagnosis not present

## 2020-02-03 DIAGNOSIS — E7800 Pure hypercholesterolemia, unspecified: Secondary | ICD-10-CM | POA: Diagnosis not present

## 2020-02-04 ENCOUNTER — Telehealth: Payer: Self-pay | Admitting: Physician Assistant

## 2020-02-04 LAB — BASIC METABOLIC PANEL
BUN/Creatinine Ratio: 14 (ref 10–24)
BUN: 22 mg/dL (ref 8–27)
CO2: 18 mmol/L — ABNORMAL LOW (ref 20–29)
Calcium: 9.5 mg/dL (ref 8.6–10.2)
Chloride: 104 mmol/L (ref 96–106)
Creatinine, Ser: 1.62 mg/dL — ABNORMAL HIGH (ref 0.76–1.27)
GFR calc Af Amer: 52 mL/min/{1.73_m2} — ABNORMAL LOW (ref >59)
GFR calc non Af Amer: 45 mL/min/{1.73_m2} — ABNORMAL LOW (ref >59)
Glucose: 120 mg/dL — ABNORMAL HIGH (ref 65–99)
Potassium: 5.1 mmol/L (ref 3.5–5.2)
Sodium: 136 mmol/L (ref 134–144)

## 2020-02-04 LAB — PSA TOTAL (REFLEX TO FREE): Prostate Specific Ag, Serum: 9.6 ng/mL — ABNORMAL HIGH (ref 0.0–4.0)

## 2020-02-04 LAB — FPSA% REFLEX
% FREE PSA: 20.9 %
PSA, FREE: 2.01 ng/mL

## 2020-02-04 NOTE — Telephone Encounter (Signed)
Patient calling to confirm lab results previously discussed on 02/04/20. Patient states that he had difficulty understanding the results initially due to a language barrier.

## 2020-02-19 IMAGING — US US EXTREM LOW VENOUS*R*
1 series · 13 of 24 positions shown · non-contrast
Comparison: None.

CLINICAL DATA: Right lower extremity pain for 2-3 days without
injury.



[Series 1: us extrem low venous*right* · 0.08mm/px · 13 of 34 slices shown]
[im 1/34]
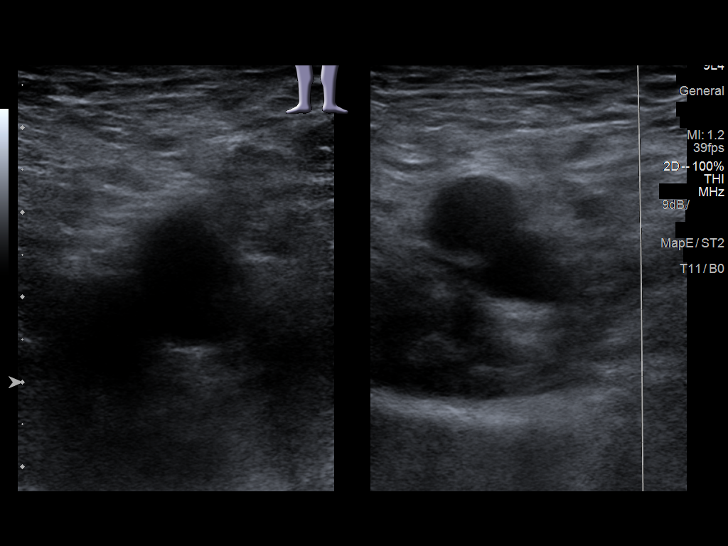
[im 3/34]
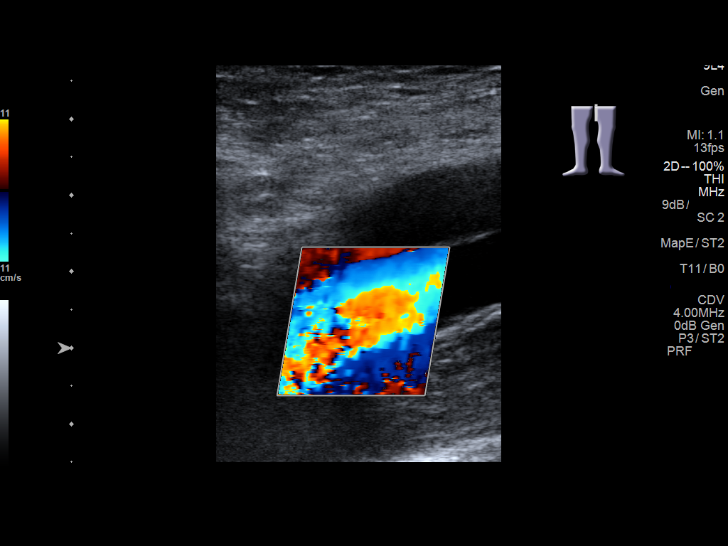
[im 6/34]
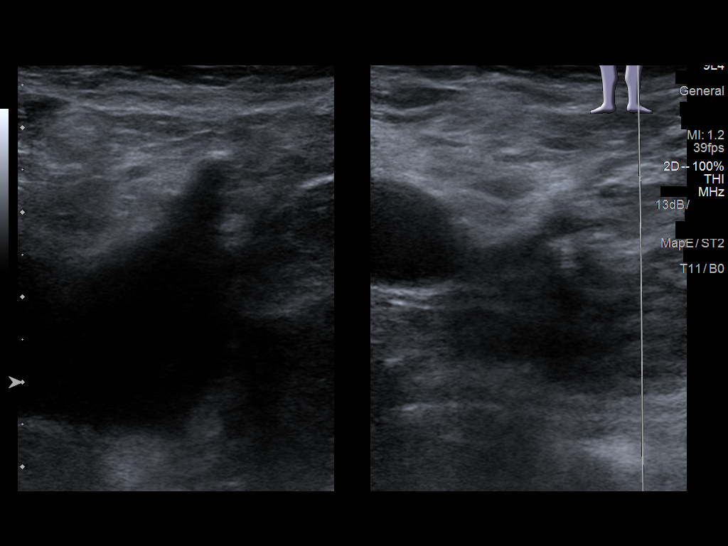
[im 9/34]
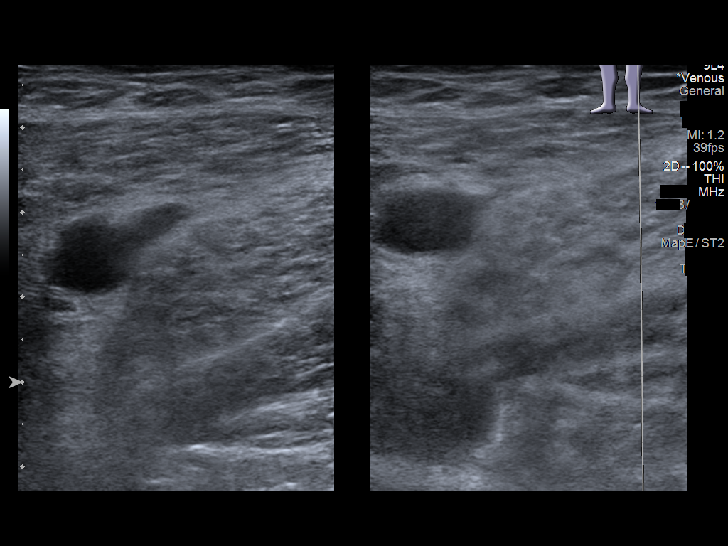
[im 12/34]
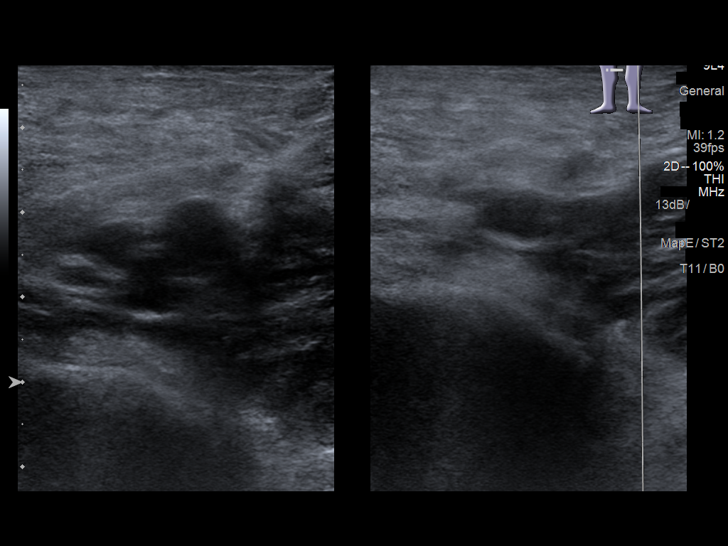
[im 15/34]
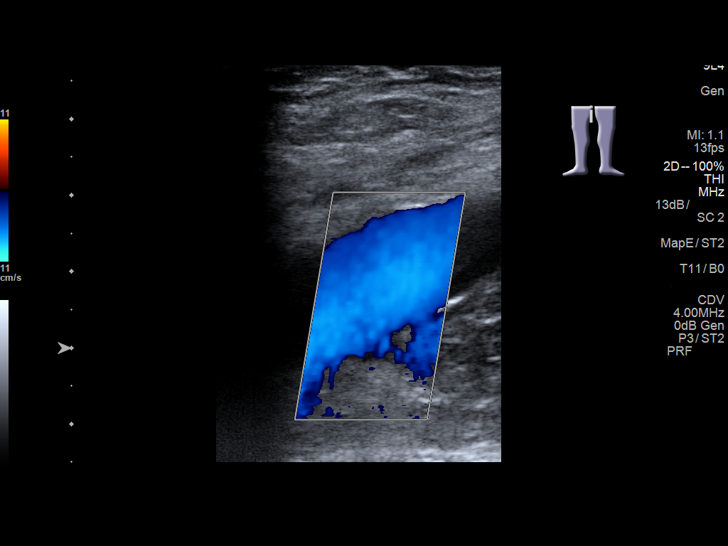
[im 18/34]
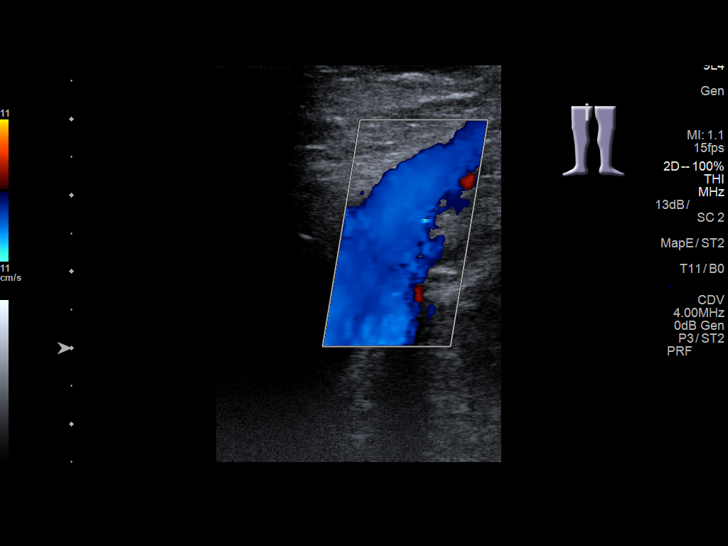
[im 19/34]
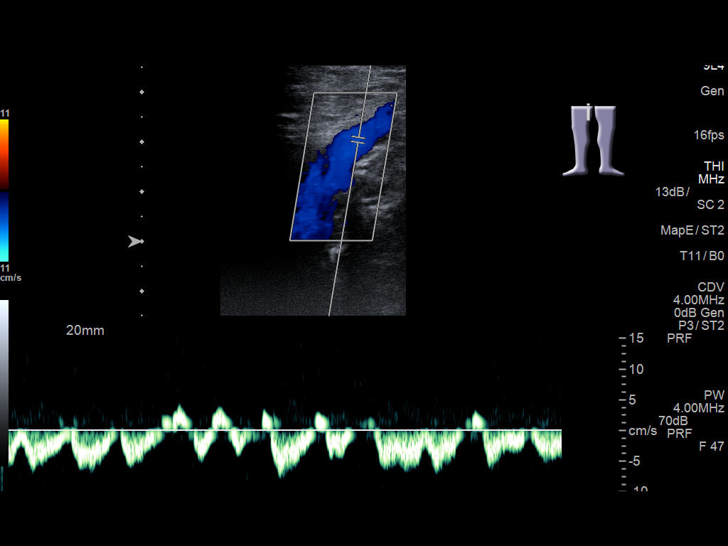
[im 22/34]
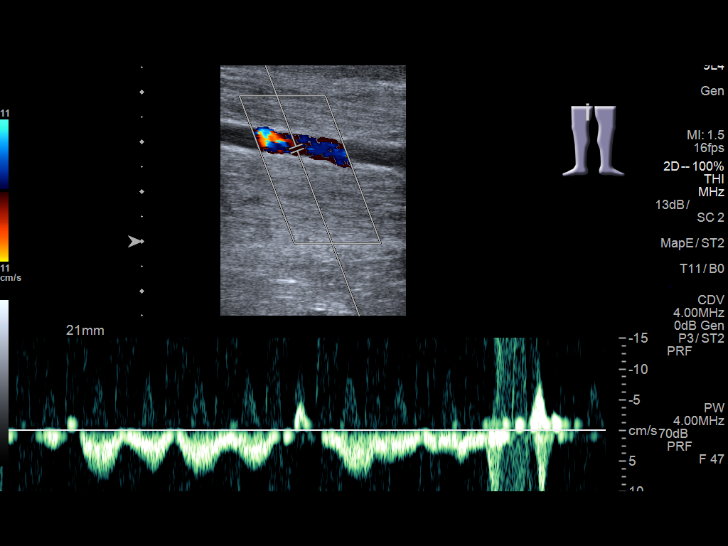
[im 25/34]
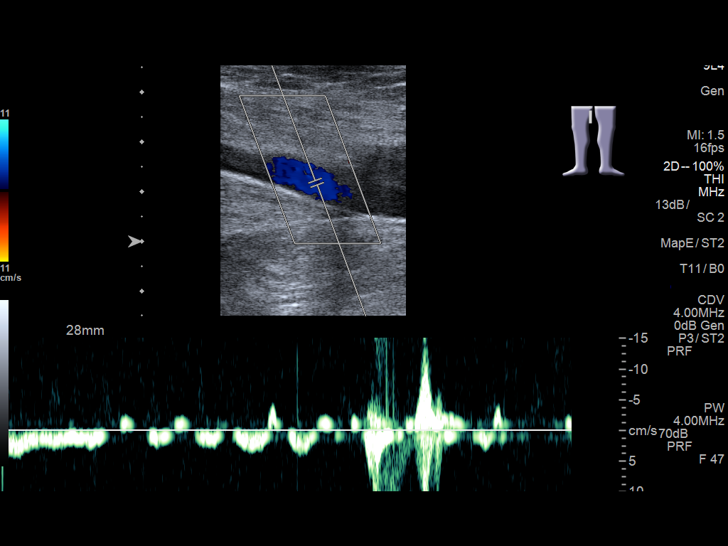
[im 28/34]
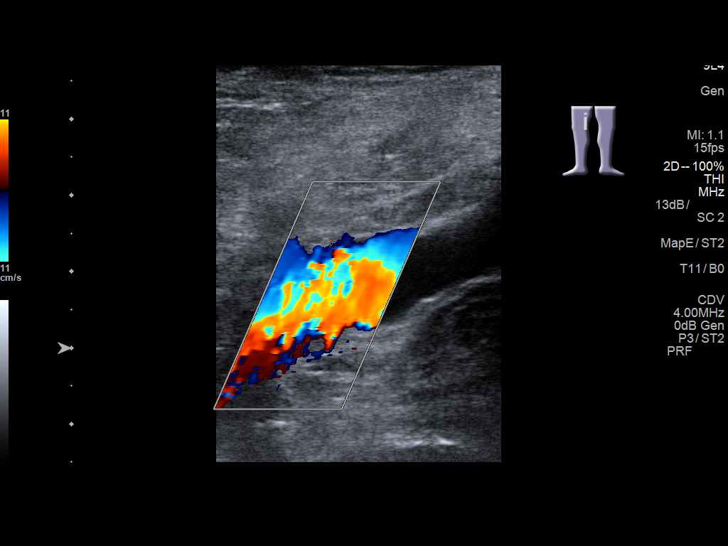
[im 31/34]
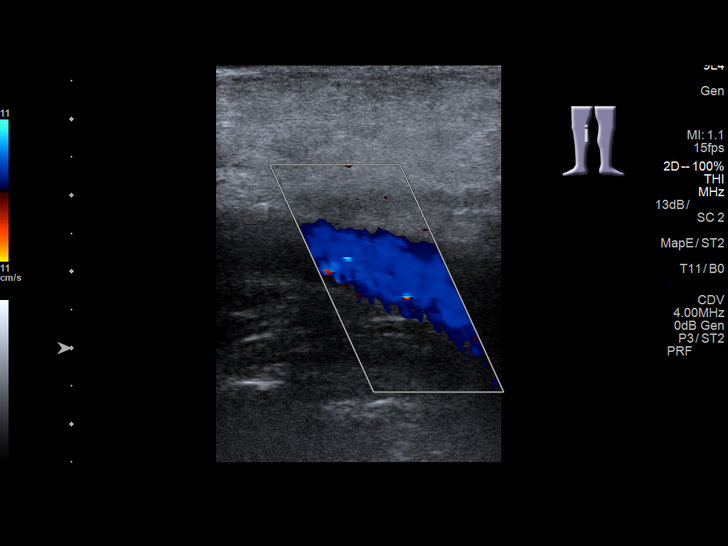
[im 34/34]
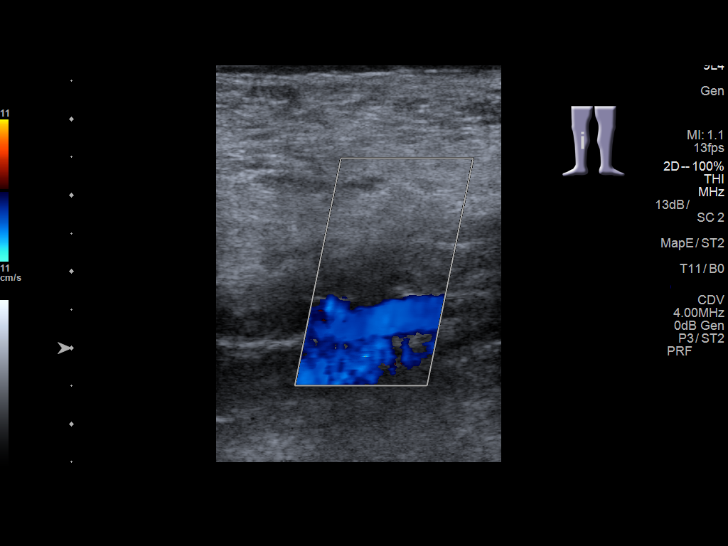

[13 of 24 positions shown; findings below may reference images not displayed]

FINDINGS: Contralateral Common Femoral Vein: Respiratory phasicity is normal
and symmetric with the symptomatic side. No evidence of thrombus.
Normal compressibility.

Common Femoral Vein: No evidence of thrombus. Normal
compressibility, respiratory phasicity and response to augmentation.

Saphenofemoral Junction: No evidence of thrombus. Normal
compressibility and flow on color Doppler imaging.

Profunda Femoral Vein: No evidence of thrombus. Normal
compressibility and flow on color Doppler imaging.

Femoral Vein: No evidence of thrombus. Normal compressibility,
respiratory phasicity and response to augmentation.

Popliteal Vein: No evidence of thrombus. Normal compressibility,
respiratory phasicity and response to augmentation.

Calf Veins: No evidence of thrombus. Normal compressibility and flow
on color Doppler imaging.

Superficial Great Saphenous Vein: No evidence of thrombus. Normal
compressibility.

Venous Reflux:  None.

Other Findings:  None.
IMPRESSION: No evidence of deep venous thrombosis.

## 2020-02-25 ENCOUNTER — Other Ambulatory Visit: Payer: Self-pay | Admitting: Family Medicine

## 2020-03-10 ENCOUNTER — Ambulatory Visit: Payer: Self-pay | Admitting: Physician Assistant

## 2020-03-23 ENCOUNTER — Ambulatory Visit (INDEPENDENT_AMBULATORY_CARE_PROVIDER_SITE_OTHER): Payer: BC Managed Care – PPO | Admitting: Physician Assistant

## 2020-03-23 ENCOUNTER — Other Ambulatory Visit: Payer: Self-pay

## 2020-03-23 ENCOUNTER — Encounter: Payer: Self-pay | Admitting: Physician Assistant

## 2020-03-23 VITALS — BP 110/78 | HR 96 | Temp 97.7°F | Wt 217.0 lb

## 2020-03-23 DIAGNOSIS — R7989 Other specified abnormal findings of blood chemistry: Secondary | ICD-10-CM

## 2020-03-23 DIAGNOSIS — R972 Elevated prostate specific antigen [PSA]: Secondary | ICD-10-CM | POA: Diagnosis not present

## 2020-03-23 DIAGNOSIS — F439 Reaction to severe stress, unspecified: Secondary | ICD-10-CM

## 2020-03-23 DIAGNOSIS — N179 Acute kidney failure, unspecified: Secondary | ICD-10-CM | POA: Diagnosis not present
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
## 2020-03-24 LAB — BASIC METABOLIC PANEL
BUN/Creatinine Ratio: 14 (ref 10–24)
BUN: 28 mg/dL — ABNORMAL HIGH (ref 8–27)
CO2: 16 mmol/L — ABNORMAL LOW (ref 20–29)
Calcium: 9.3 mg/dL (ref 8.6–10.2)
Chloride: 102 mmol/L (ref 96–106)
Creatinine, Ser: 2.06 mg/dL — ABNORMAL HIGH (ref 0.76–1.27)
GFR calc Af Amer: 39 mL/min/{1.73_m2} — ABNORMAL LOW (ref >59)
GFR calc non Af Amer: 34 mL/min/{1.73_m2} — ABNORMAL LOW (ref >59)
Glucose: 85 mg/dL (ref 65–99)
Potassium: 4.8 mmol/L (ref 3.5–5.2)
Sodium: 136 mmol/L (ref 134–144)

## 2020-03-24 LAB — PSA: Prostate Specific Ag, Serum: 7.9 ng/mL — ABNORMAL HIGH (ref 0.0–4.0)

## 2020-04-04 ENCOUNTER — Encounter: Payer: Self-pay | Admitting: Physician Assistant

## 2020-04-12 ENCOUNTER — Telehealth: Payer: Self-pay | Admitting: Physician Assistant

## 2020-04-12 NOTE — Telephone Encounter (Signed)
Pt advised.   Thanks,   -Kewanda Poland  

## 2020-04-12 NOTE — Telephone Encounter (Signed)
Yes. Should hold aspirin for 5 days before procedure

## 2020-04-12 NOTE — Telephone Encounter (Signed)
Pt is calling to ask Peter [name] been taking aspirin 325mg  for many years he is getting ready to get oral surgery when should he stop taking the aspirin before the surgery? CB [phone]

## 2020-04-12 NOTE — Telephone Encounter (Signed)
Please advise 

## 2020-04-28 ENCOUNTER — Other Ambulatory Visit: Payer: Self-pay | Admitting: Family Medicine

## 2020-04-28 DIAGNOSIS — K219 Gastro-esophageal reflux disease without esophagitis: Secondary | ICD-10-CM

## 2020-05-31 ENCOUNTER — Other Ambulatory Visit: Payer: Self-pay

## 2020-05-31 ENCOUNTER — Telehealth: Payer: Self-pay | Admitting: Physician Assistant

## 2020-05-31 DIAGNOSIS — G8929 Other chronic pain: Secondary | ICD-10-CM

## 2020-05-31 MED ORDER — TRAMADOL HCL 50 MG PO TABS: 50.0000 mg | ORAL_TABLET | Freq: Four times a day (QID) | ORAL | 5 refills | Status: DC | PRN

## 2020-05-31 MED ORDER — BENAZEPRIL HCL 40 MG PO TABS: 40.0000 mg | ORAL_TABLET | Freq: Every day | ORAL | 0 refills | Status: DC

## 2020-05-31 NOTE — Telephone Encounter (Signed)
Medication: benazepril (LOTENSIN) 40 MG tablet [[id number]] , traMADol (ULTRAM) 50 MG tablet [[id number]] , -Requesting 6 months prescribtions.  Has the patient contacted their pharmacy? YES (Agent: If no, request that the patient contact the pharmacy for the refill.) (Agent: If yes, when and what did the pharmacy advise?)  Preferred Pharmacy (with phone number or street name): [facility] [place], [place] - [address] [place] [address] Phone: [phone] Fax: [phone] Hours: Not open 24 hours    Agent: Please be advised that RX refills may take up to 3 business days. We ask that you follow-up with your pharmacy.

## 2020-08-17 ENCOUNTER — Other Ambulatory Visit: Payer: Self-pay | Admitting: Physician Assistant

## 2020-08-17 DIAGNOSIS — K219 Gastro-esophageal reflux disease without esophagitis: Secondary | ICD-10-CM

## 2020-09-13 ENCOUNTER — Telehealth: Payer: Self-pay

## 2020-09-13 NOTE — Telephone Encounter (Signed)
Copied from CRM [id number]. 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

## 2020-09-14 ENCOUNTER — Other Ambulatory Visit: Payer: Self-pay

## 2020-09-14 ENCOUNTER — Ambulatory Visit: Payer: BC Managed Care – PPO | Admitting: Family Medicine

## 2020-09-14 ENCOUNTER — Encounter: Payer: Self-pay | Admitting: Family Medicine

## 2020-09-14 VITALS — BP 141/94 | HR 100 | Temp 98.3°F | Resp 16 | Wt 217.2 lb

## 2020-09-14 DIAGNOSIS — I10 Essential (primary) hypertension: Secondary | ICD-10-CM

## 2020-09-14 DIAGNOSIS — N179 Acute kidney failure, unspecified: Secondary | ICD-10-CM

## 2020-09-14 DIAGNOSIS — R972 Elevated prostate specific antigen [PSA]: Secondary | ICD-10-CM

## 2020-09-14 DIAGNOSIS — G8929 Other chronic pain: Secondary | ICD-10-CM

## 2020-09-14 DIAGNOSIS — M25562 Pain in left knee: Secondary | ICD-10-CM

## 2020-09-14 DIAGNOSIS — M25561 Pain in right knee: Secondary | ICD-10-CM

## 2020-09-14 DIAGNOSIS — E7800 Pure hypercholesterolemia, unspecified: Secondary | ICD-10-CM

## 2020-09-14 DIAGNOSIS — D649 Anemia, unspecified: Secondary | ICD-10-CM

## 2020-09-14 DIAGNOSIS — N5203 Combined arterial insufficiency and corporo-venous occlusive erectile dysfunction: Secondary | ICD-10-CM

## 2020-09-14 DIAGNOSIS — M545 Low back pain, unspecified: Secondary | ICD-10-CM

## 2020-09-14 DIAGNOSIS — K219 Gastro-esophageal reflux disease without esophagitis: Secondary | ICD-10-CM

## 2020-09-14 MED ORDER — BENAZEPRIL HCL 40 MG PO TABS: 40.0000 mg | ORAL_TABLET | Freq: Every day | ORAL | 1 refills | Status: DC

## 2020-09-14 MED ORDER — TADALAFIL 10 MG PO TABS: 10.0000 mg | ORAL_TABLET | Freq: Every day | ORAL | 5 refills | Status: DC

## 2020-09-14 NOTE — Assessment & Plan Note (Addendum)
Recheck  Being treated for BPH with flomax and cialis - will continue

## 2020-09-14 NOTE — Assessment & Plan Note (Signed)
Elevated previously Not taking Crestor Recheck FLP and CMP
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
## 2020-09-14 NOTE — Assessment & Plan Note (Signed)
chroinc and well controlled Continue PPI
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
## 2020-09-14 NOTE — Assessment & Plan Note (Signed)
Typically well controlled, but out of Benazepril Will resume Recheck CMP

## 2020-09-15 ENCOUNTER — Telehealth: Payer: Self-pay

## 2020-09-15 LAB — PSA TOTAL (REFLEX TO FREE): Prostate Specific Ag, Serum: 10.3 ng/mL — ABNORMAL HIGH (ref 0.0–4.0)

## 2020-09-15 LAB — LIPID PANEL
Chol/HDL Ratio: 6.1 ratio — ABNORMAL HIGH (ref 0.0–5.0)
Cholesterol, Total: 261 mg/dL — ABNORMAL HIGH (ref 100–199)
HDL: 43 mg/dL (ref >39)
LDL Chol Calc (NIH): 182 mg/dL — ABNORMAL HIGH (ref 0–99)
Triglycerides: 191 mg/dL — ABNORMAL HIGH (ref 0–149)
VLDL Cholesterol Cal: 36 mg/dL (ref 5–40)

## 2020-09-15 LAB — CBC
Hematocrit: 37.8 % (ref 37.5–51.0)
Hemoglobin: 12.9 g/dL — ABNORMAL LOW (ref 13.0–17.7)
MCH: 31 pg (ref 26.6–33.0)
MCHC: 34.1 g/dL (ref 31.5–35.7)
MCV: 91 fL (ref 79–97)
Platelets: 369 10*3/uL (ref 150–450)
RBC: 4.16 x10E6/uL (ref 4.14–5.80)
RDW: 13 % (ref 11.6–15.4)
WBC: 6.4 10*3/uL (ref 3.4–10.8)

## 2020-09-15 LAB — COMPREHENSIVE METABOLIC PANEL
ALT: 13 IU/L (ref 0–44)
AST: 20 IU/L (ref 0–40)
Albumin/Globulin Ratio: 1.8 (ref 1.2–2.2)
Albumin: 4.4 g/dL (ref 3.8–4.8)
Alkaline Phosphatase: 72 IU/L (ref 44–121)
BUN/Creatinine Ratio: 13 (ref 10–24)
BUN: 23 mg/dL (ref 8–27)
Bilirubin Total: 0.2 mg/dL (ref 0.0–1.2)
CO2: 21 mmol/L (ref 20–29)
Calcium: 9.9 mg/dL (ref 8.6–10.2)
Chloride: 103 mmol/L (ref 96–106)
Creatinine, Ser: 1.74 mg/dL — ABNORMAL HIGH (ref 0.76–1.27)
Globulin, Total: 2.4 g/dL (ref 1.5–4.5)
Glucose: 87 mg/dL (ref 65–99)
Potassium: 4.5 mmol/L (ref 3.5–5.2)
Sodium: 137 mmol/L (ref 134–144)
Total Protein: 6.8 g/dL (ref 6.0–8.5)
eGFR: 44 mL/min/{1.73_m2} — ABNORMAL LOW (ref >59)

## 2020-09-15 NOTE — Telephone Encounter (Signed)
Peter [name] has been advised and will reach back out to paitent. KW

## 2020-09-15 NOTE — Telephone Encounter (Signed)
Copied from CRM [id number]. Topic: General - Other >> Sep 15, 2020  8:57 AM Jaquita Rector A wrote: Reason for CRM: Patient called in asking for Victorino Dike at the front desk to please call him have some information to give her. Please call Ph# [phone]

## 2020-09-15 NOTE — Telephone Encounter (Signed)
Called pt and answered his questions about how much he paid at his ov on 09/14/2020

## 2020-09-16 NOTE — Progress Notes (Signed)
Atorvastatin 40 mg daily with CoQ10 (OTC) 100-200mg  daily to prevent myalgias.

## 2020-09-17 ENCOUNTER — Other Ambulatory Visit: Payer: Self-pay

## 2020-09-17 NOTE — Telephone Encounter (Signed)
-----   Message from Erasmo Downer, MD sent at 09/16/2020  4:23 PM EDT ----- Atorvastatin 40 mg daily with CoQ10 (OTC) 100-200mg  daily to prevent myalgias.

## 2020-09-20 MED ORDER — COENZYME Q10 100 MG PO CAPS: ORAL_CAPSULE | ORAL | 0 refills | Status: DC

## 2020-09-20 NOTE — Telephone Encounter (Signed)
Actually cancel Atorvastatin. See if he will take half dose (5mg ) of Crestor with CoQ10. Sent Rx for CoQ10, but available OTC

## 2020-11-19 ENCOUNTER — Other Ambulatory Visit: Payer: Self-pay | Admitting: Physician Assistant

## 2020-11-19 DIAGNOSIS — K219 Gastro-esophageal reflux disease without esophagitis: Secondary | ICD-10-CM

## 2020-12-06 ENCOUNTER — Telehealth: Payer: Self-pay | Admitting: Family Medicine

## 2020-12-06 DIAGNOSIS — G8929 Other chronic pain: Secondary | ICD-10-CM

## 2020-12-06 MED ORDER — TRAMADOL HCL 50 MG PO TABS: 50.0000 mg | ORAL_TABLET | Freq: Four times a day (QID) | ORAL | 5 refills | Status: DC | PRN

## 2020-12-06 NOTE — Telephone Encounter (Signed)
Rx sent 

## 2020-12-06 NOTE — Telephone Encounter (Signed)
Pt states that he has contacted pharmacy and they have not received the new prescription for pts tramadol prescription. Peter [name] is requesting to have that sent to pharmacy so that he has not trouble refilling when he needs to next month. Please advise.

## 2021-01-14 ENCOUNTER — Other Ambulatory Visit: Payer: Self-pay | Admitting: Physician Assistant

## 2021-01-14 DIAGNOSIS — R972 Elevated prostate specific antigen [PSA]: Secondary | ICD-10-CM

## 2021-02-22 ENCOUNTER — Other Ambulatory Visit: Payer: Self-pay | Admitting: Family Medicine

## 2021-02-22 DIAGNOSIS — K219 Gastro-esophageal reflux disease without esophagitis: Secondary | ICD-10-CM

## 2021-02-22 NOTE — Telephone Encounter (Signed)
Requested Prescriptions  Pending Prescriptions Disp Refills   omeprazole (PRILOSEC) 20 MG capsule [Pharmacy Med Name: OMEPRAZOLE DR 20 MG CAPSULE] 180 capsule 2    Sig: Take 1 capsule (20 mg total) by mouth 2 (two) times daily     Gastroenterology: Proton Pump Inhibitors Passed - 02/22/2021  2:23 PM      Passed - Valid encounter within last 12 months    Recent Outpatient Visits          5 months ago Primary hypertension   [facility] [place], Dionne Bucy, [place]   11 months ago AKI (acute kidney injury) [facility])   [place], Clearnce Sorrel, [place]   1 year ago Annual physical exam   [place], Clearnce Sorrel, [place]   1 year ago Diarrhea, unspecified type   [facility] Clearnce Sorrel, [place]   1 year ago Essential hypertension   [place], Clearnce Sorrel, [place]      Future Appointments            In 3 weeks Gwyneth Sprout, [place], [place]

## 2021-03-12 ENCOUNTER — Other Ambulatory Visit: Payer: Self-pay | Admitting: Family Medicine

## 2021-03-12 DIAGNOSIS — N5203 Combined arterial insufficiency and corporo-venous occlusive erectile dysfunction: Secondary | ICD-10-CM

## 2021-03-14 NOTE — Telephone Encounter (Signed)
Requested Prescriptions  Pending Prescriptions Disp Refills   tadalafil (CIALIS) 10 MG tablet [Pharmacy Med Name: TADALAFIL 10 MG TABLET] 30 tablet 0    Sig: Take 1 tablet (10 mg total) by mouth daily.     Urology: Erectile Dysfunction Agents Failed - 03/12/2021  9:15 AM      Failed - Last BP in normal range    BP Readings from Last 1 Encounters:  09/14/20 (!) 141/94         Passed - AST in normal range and within 360 days    AST  Date Value Ref Range Status  09/14/2020 20 0 - 40 IU/L Final         Passed - ALT in normal range and within 360 days    ALT  Date Value Ref Range Status  09/14/2020 13 0 - 44 IU/L Final         Passed - Valid encounter within last 12 months    Recent Outpatient Visits          6 months ago Primary hypertension   [facility] [place], Marzella Schlein, [place]   11 months ago AKI (acute kidney injury) [facility])   [facility] [place], Alessandra Bevels, [place]   1 year ago Annual physical exam   [facility] [place], Alessandra Bevels, [place]   1 year ago Diarrhea, unspecified type   [facility], Alessandra Bevels, [place]   1 year ago Essential hypertension   [facility] [place], Alessandra Bevels, [place]      Future Appointments            In 3 days Jacky Kindle, FNP [facility], [facility]
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
## 2021-03-17 ENCOUNTER — Other Ambulatory Visit: Payer: Self-pay

## 2021-03-17 ENCOUNTER — Encounter: Payer: BC Managed Care – PPO | Admitting: Family Medicine

## 2021-03-17 ENCOUNTER — Encounter: Payer: Self-pay | Admitting: Family Medicine

## 2021-03-17 ENCOUNTER — Ambulatory Visit (INDEPENDENT_AMBULATORY_CARE_PROVIDER_SITE_OTHER): Payer: BC Managed Care – PPO | Admitting: Family Medicine

## 2021-03-17 VITALS — BP 112/80 | HR 97 | Temp 98.0°F | Wt 222.0 lb

## 2021-03-17 DIAGNOSIS — Z125 Encounter for screening for malignant neoplasm of prostate: Secondary | ICD-10-CM | POA: Insufficient documentation

## 2021-03-17 DIAGNOSIS — Z0289 Encounter for other administrative examinations: Secondary | ICD-10-CM | POA: Insufficient documentation

## 2021-03-17 DIAGNOSIS — G8929 Other chronic pain: Secondary | ICD-10-CM

## 2021-03-17 DIAGNOSIS — N1832 Chronic kidney disease, stage 3b: Secondary | ICD-10-CM | POA: Diagnosis not present

## 2021-03-17 DIAGNOSIS — E7800 Pure hypercholesterolemia, unspecified: Secondary | ICD-10-CM

## 2021-03-17 DIAGNOSIS — I10 Essential (primary) hypertension: Secondary | ICD-10-CM | POA: Diagnosis not present

## 2021-03-17 DIAGNOSIS — Z5320 Procedure and treatment not carried out because of patient's decision for unspecified reasons: Secondary | ICD-10-CM | POA: Insufficient documentation

## 2021-03-17 DIAGNOSIS — M25561 Pain in right knee: Secondary | ICD-10-CM | POA: Diagnosis not present

## 2021-03-17 DIAGNOSIS — Z0000 Encounter for general adult medical examination without abnormal findings: Secondary | ICD-10-CM | POA: Diagnosis not present

## 2021-03-17 DIAGNOSIS — Z1211 Encounter for screening for malignant neoplasm of colon: Secondary | ICD-10-CM | POA: Insufficient documentation

## 2021-03-17 DIAGNOSIS — R739 Hyperglycemia, unspecified: Secondary | ICD-10-CM | POA: Insufficient documentation

## 2021-03-17 MED ORDER — BENAZEPRIL HCL 40 MG PO TABS: 40.0000 mg | ORAL_TABLET | Freq: Every day | ORAL | 3 refills | Status: DC

## 2021-03-17 MED ORDER — TRAMADOL HCL 50 MG PO TABS: 50.0000 mg | ORAL_TABLET | Freq: Four times a day (QID) | ORAL | 5 refills | Status: DC | PRN

## 2021-03-17 NOTE — Assessment & Plan Note (Signed)
Will get lower dentures soon Things to do to keep yourself healthy  - Exercise at least 30-45 minutes a day, 3-4 days a week.  - Eat a low-fat diet with lots of fruits and vegetables, up to 7-9 servings per day.  - Seatbelts can save your life. Wear them always.  - Smoke detectors on every level of your home, check batteries every year.  - Eye Doctor - have an eye exam every 1-2 years  - Safe sex - if you may be exposed to STDs, use a condom.  - Alcohol -  If you drink, do it moderately, less than 2 drinks per day.  - [place]. Choose someone to speak for you if you are not able.  - Depression is common in our stressful world.If you're feeling down or losing interest in things you normally enjoy, please come in for a visit.  - Violence - If anyone is threatening or hurting you, please call immediately.

## 2021-03-17 NOTE — Assessment & Plan Note (Signed)
PSA pending

## 2021-03-17 NOTE — Patient Instructions (Signed)
The 10-year ASCVD risk score (Arnett DK, et al., 2019) is: 13.7%   Values used to calculate the score:     Age: 66 years     Sex: Male     Is Non-Hispanic African American: No     Diabetic: No     Tobacco smoker: No     Systolic Blood Pressure: XX123456 mmHg     Is BP treated: Yes     HDL Cholesterol: 43 mg/dL     Total Cholesterol: 261 mg/dL

## 2021-03-17 NOTE — Assessment & Plan Note (Addendum)
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

## 2021-03-17 NOTE — Assessment & Plan Note (Signed)
Repeat A1c 
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
## 2021-03-17 NOTE — Assessment & Plan Note (Signed)
Referral for colo guard

## 2021-03-17 NOTE — Assessment & Plan Note (Signed)
Chronic, stable Discussed coming off of medication; voiced that he got on medication d/t stress of son and his son is now in Event organiser and is about to get married and no longer a source of his stress Denies CP Denies SOB Denies DOE No LE Edema noted on exam Continue medication Refills provided Seek emergent care if you develop CP, chest pain or chest pressure

## 2021-03-17 NOTE — Assessment & Plan Note (Signed)
Refused statin d/t muscle aches Discussed current risk for heart attack and stroke at 13% Encouraged to revisit lower dose medication to assist Pt refused

## 2021-03-18 ENCOUNTER — Other Ambulatory Visit: Payer: Self-pay | Admitting: Family Medicine

## 2021-03-18 DIAGNOSIS — R972 Elevated prostate specific antigen [PSA]: Secondary | ICD-10-CM

## 2021-03-18 DIAGNOSIS — N1832 Chronic kidney disease, stage 3b: Secondary | ICD-10-CM

## 2021-03-18 LAB — LIPID PANEL
Chol/HDL Ratio: 5.3 ratio — ABNORMAL HIGH (ref 0.0–5.0)
Cholesterol, Total: 270 mg/dL — ABNORMAL HIGH (ref 100–199)
HDL: 51 mg/dL (ref >39)
LDL Chol Calc (NIH): 186 mg/dL — ABNORMAL HIGH (ref 0–99)
Triglycerides: 178 mg/dL — ABNORMAL HIGH (ref 0–149)
VLDL Cholesterol Cal: 33 mg/dL (ref 5–40)

## 2021-03-18 LAB — COMPREHENSIVE METABOLIC PANEL
ALT: 16 IU/L (ref 0–44)
AST: 17 IU/L (ref 0–40)
Albumin/Globulin Ratio: 2.2 (ref 1.2–2.2)
Albumin: 4.6 g/dL (ref 3.8–4.8)
Alkaline Phosphatase: 66 IU/L (ref 44–121)
BUN/Creatinine Ratio: 18 (ref 10–24)
BUN: 35 mg/dL — ABNORMAL HIGH (ref 8–27)
Bilirubin Total: 0.3 mg/dL (ref 0.0–1.2)
CO2: 21 mmol/L (ref 20–29)
Calcium: 9.5 mg/dL (ref 8.6–10.2)
Chloride: 103 mmol/L (ref 96–106)
Creatinine, Ser: 1.97 mg/dL — ABNORMAL HIGH (ref 0.76–1.27)
Globulin, Total: 2.1 g/dL (ref 1.5–4.5)
Glucose: 92 mg/dL (ref 70–99)
Potassium: 4.8 mmol/L (ref 3.5–5.2)
Sodium: 137 mmol/L (ref 134–144)
Total Protein: 6.7 g/dL (ref 6.0–8.5)
eGFR: 37 mL/min/{1.73_m2} — ABNORMAL LOW (ref >59)

## 2021-03-18 LAB — HEMOGLOBIN A1C
Est. average glucose Bld gHb Est-mCnc: 108 mg/dL
Hgb A1c MFr Bld: 5.4 % (ref 4.8–5.6)

## 2021-03-18 LAB — PSA: Prostate Specific Ag, Serum: 10.6 ng/mL — ABNORMAL HIGH (ref 0.0–4.0)

## 2021-03-18 MED ORDER — DAPAGLIFLOZIN PROPANEDIOL 10 MG PO TABS: 10.0000 mg | ORAL_TABLET | Freq: Every day | ORAL | 3 refills | Status: DC

## 2021-03-18 MED ORDER — SIMVASTATIN 40 MG PO TABS: 40.0000 mg | ORAL_TABLET | Freq: Every day | ORAL | 3 refills | Status: DC

## 2021-03-18 MED ORDER — COENZYME Q10 100 MG PO CAPS: 300.0000 mg | ORAL_CAPSULE | Freq: Two times a day (BID) | ORAL | 3 refills | Status: DC

## 2021-03-21 ENCOUNTER — Telehealth: Payer: Self-pay

## 2021-03-21 NOTE — Telephone Encounter (Signed)
Copied from CRM [id number]. Topic: General - Other >> Mar 21, 2021 12:35 PM Peter [name] A wrote: Reason for CRM: The patient would like to speak with a member of clinical staff when possible about their tadalafil (CIALIS) 10 MG tablet [[id number]]  refill   Please contact further when available

## 2021-03-21 NOTE — Telephone Encounter (Signed)
Called and spoke with patient he stated he had no further questions about Cialis but did inquire why he was started on Farxiga I reviewed over him his recent lab report results and what was recommenced by nurse practioner from 03/17/21. Patient verbalized understanding. KW

## 2021-04-04 ENCOUNTER — Telehealth: Payer: Self-pay

## 2021-04-04 NOTE — Telephone Encounter (Signed)
Copied from [place] [id number]. Topic: General - Other >> Apr 04, 2021 12:41 PM Parke Poisson wrote: Reason for CRM:FYI--Pt refused referral to nephrology,Thanks

## 2021-04-04 NOTE — Telephone Encounter (Signed)
Noted  

## 2021-04-08 ENCOUNTER — Other Ambulatory Visit: Payer: Self-pay | Admitting: Physician Assistant

## 2021-04-08 DIAGNOSIS — R972 Elevated prostate specific antigen [PSA]: Secondary | ICD-10-CM

## 2021-05-11 ENCOUNTER — Ambulatory Visit: Payer: BC Managed Care – PPO | Admitting: Urology

## 2021-06-02 ENCOUNTER — Telehealth: Payer: Self-pay | Admitting: Family Medicine

## 2021-06-02 NOTE — Telephone Encounter (Signed)
Called patient and informed him that Jolene stated it was okay. Pt states he will call back to schedule an appointment. ?
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
## 2021-06-11 ENCOUNTER — Other Ambulatory Visit: Payer: Self-pay | Admitting: Family Medicine

## 2021-06-11 DIAGNOSIS — N5203 Combined arterial insufficiency and corporo-venous occlusive erectile dysfunction: Secondary | ICD-10-CM

## 2021-06-13 NOTE — Telephone Encounter (Signed)
Requested Prescriptions  ?Pending Prescriptions Disp Refills  ?? tadalafil (CIALIS) 10 MG tablet [Pharmacy Med Name: TADALAFIL 10 MG TABLET] 30 tablet 0  ?  Sig: Take 1 tablet (10 mg total) by mouth daily.  ?  ? Urology: Erectile Dysfunction Agents Passed - 06/11/2021  1:04 PM  ?  ?  Passed - AST in normal range and within 360 days  ?  AST  ?Date Value Ref Range Status  ?03/17/2021 17 0 - 40 IU/L Final  ?   ?  ?  Passed - ALT in normal range and within 360 days  ?  ALT  ?Date Value Ref Range Status  ?03/17/2021 16 0 - 44 IU/L Final  ?   ?  ?  Passed - Last BP in normal range  ?  BP Readings from Last 1 Encounters:  ?03/17/21 112/80  ?   ?  ?  Passed - Valid encounter within last 12 months  ?  Recent Outpatient Visits   ?      ? 2 months ago Annual physical exam  ? [facility] Jacky Kindle, FNP  ? 9 months ago Primary hypertension  ? [facility], Marzella Schlein, [place]  ? 1 year ago AKI (acute kidney injury) [facility])  ? [facility] [place], Alessandra Bevels, [place]  ? 1 year ago Annual physical exam  ? [facility] [name], [place]  ? 1 year ago Diarrhea, unspecified type  ? [facility] [place], [place], [place]  ?  ?  ?Future Appointments   ?        ? In 9 months Jacky Kindle, FNP [facility], [facility]  ?  ? ?  ?  ?  ? ? ?

## 2021-06-29 ENCOUNTER — Encounter: Payer: Self-pay | Admitting: Nurse Practitioner

## 2021-06-29 ENCOUNTER — Ambulatory Visit (INDEPENDENT_AMBULATORY_CARE_PROVIDER_SITE_OTHER): Payer: Self-pay | Admitting: Nurse Practitioner

## 2021-06-29 VITALS — BP 100/64 | HR 93 | Temp 98.0°F | Ht 71.5 in | Wt 222.6 lb

## 2021-06-29 DIAGNOSIS — Z0289 Encounter for other administrative examinations: Secondary | ICD-10-CM

## 2021-06-29 LAB — URINALYSIS, ROUTINE W REFLEX MICROSCOPIC
Bilirubin, UA: NEGATIVE
Ketones, UA: NEGATIVE
Nitrite, UA: NEGATIVE
Protein,UA: NEGATIVE
RBC, UA: NEGATIVE
Specific Gravity, UA: <1.005 — ABNORMAL LOW (ref 1.005–1.030)
Urobilinogen, Ur: 0.2 mg/dL (ref 0.2–1.0)
pH, UA: 5.5 (ref 5.0–7.5)

## 2021-06-29 NOTE — Patient Instructions (Signed)
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
## 2021-07-12 ENCOUNTER — Other Ambulatory Visit: Payer: Self-pay | Admitting: Family Medicine

## 2021-07-12 DIAGNOSIS — R972 Elevated prostate specific antigen [PSA]: Secondary | ICD-10-CM

## 2021-07-13 NOTE — Telephone Encounter (Signed)
Requested Prescriptions  Pending Prescriptions Disp Refills  . tamsulosin (FLOMAX) 0.4 MG CAPS capsule [Pharmacy Med Name: TAMSULOSIN HCL 0.4 MG CAPSULE] 90 capsule 0    Sig: Take 1 capsule (0.4 mg total) by mouth daily.     Urology: Alpha-Adrenergic Blocker Failed - 07/12/2021  5:31 PM      Failed - PSA in normal range and within 360 days    Prostate Specific Ag, Serum  Date Value Ref Range Status  03/17/2021 10.6 (H) 0.0 - 4.0 ng/mL Final    Comment:    Roche ECLIA methodology. According to the American Urological Association, Serum PSA should decrease and remain at undetectable levels after radical prostatectomy. The AUA defines biochemical recurrence as an initial PSA value 0.2 ng/mL or greater followed by a subsequent confirmatory PSA value 0.2 ng/mL or greater. Values obtained with different assay methods or kits cannot be used interchangeably. Results cannot be interpreted as absolute evidence of the presence or absence of malignant disease.          Passed - Last BP in normal range    BP Readings from Last 1 Encounters:  06/29/21 100/64         Passed - Valid encounter within last 12 months    Recent Outpatient Visits          2 weeks ago Encounter for examination required by Department of Transportation (DOT)   [facility] [place], [place] T, NP   3 months ago Annual physical exam   [facility] Jacky Kindle, FNP   10 months ago Primary hypertension   [facility], Marzella Schlein, MD   1 year ago AKI (acute kidney injury) [facility])   [facility], Alessandra Bevels, PA-C   1 year ago Annual physical exam   [facility] [place], Alessandra Bevels, [place]      Future Appointments            In 8 months Jacky Kindle, FNP [facility]

## 2021-09-28 NOTE — Progress Notes (Signed)
Established patient visit   Patient: Peter [name]   DOB: 11/15/1955   65 y.o. Male  MRN: [id number] Visit Date: 10/07/2021  Today's healthcare provider: Gwyneth Sprout, FNP  Re Introduced to nurse practitioner role and practice setting.  All questions answered.  Discussed provider/patient relationship and expectations.  I,Tiffany J Bragg,acting as a scribe for Gwyneth Sprout, FNP.,have documented all relevant documentation on the behalf of Gwyneth Sprout, FNP,as directed by  Gwyneth Sprout, FNP while in the presence of Gwyneth Sprout, FNP.   Chief Complaint  Patient presents with   Hypertension   Medication Refill   Subjective    HPI  Hypertension, follow-up  BP Readings from Last 3 Encounters:  10/07/21 113/88  06/29/21 100/64  03/17/21 112/80   Wt Readings from Last 3 Encounters:  10/07/21 220 lb (99.8 kg)  06/29/21 222 lb 9.6 oz (101 kg)  03/17/21 222 lb (100.7 kg)     He was last seen for hypertension 7 months ago.  BP at that visit was 112/80. Management since that visit includes stopped taking HTN meds.  He reports excellent compliance with treatment. He is not having side effects.  He is following a Regular diet. He is exercising. He does not smoke.  Use of agents associated with hypertension: none.   Outside blood pressures are not checked. Symptoms: No chest pain No chest pressure  No palpitations No syncope  Yes dyspnea No orthopnea  No paroxysmal nocturnal dyspnea No lower extremity edema   Pertinent labs Lab Results  Component Value Date   CHOL 270 (H) 03/17/2021   HDL 51 03/17/2021   LDLCALC 186 (H) 03/17/2021   TRIG 178 (H) 03/17/2021   CHOLHDL 5.3 (H) 03/17/2021   Lab Results  Component Value Date   NA 137 03/17/2021   K 4.8 03/17/2021   CREATININE 1.97 (H) 03/17/2021   EGFR 37 (L) 03/17/2021   GLUCOSE 92 03/17/2021   TSH 1.700 01/27/2020     The 10-year ASCVD risk score (Arnett DK, et al., 2019) is:  13.7%   ----------------------------------------------------------------------------------------- ---------------------------------------------------------------------------------------------------   Medications: Outpatient Medications Prior to Visit  Medication Sig   acetaminophen (TYLENOL) 500 MG tablet Take 500 mg by mouth every 8 (eight) hours as needed.   aspirin EC 81 MG tablet Take 81 mg by mouth daily. Swallow whole.   Coenzyme Q10 100 MG capsule Take 3 capsules (300 mg total) by mouth 2 (two) times daily.   vitamin C (ASCORBIC ACID) 500 MG tablet Take 1,000 mg by mouth daily.   [DISCONTINUED] benazepril (LOTENSIN) 40 MG tablet Take 1 tablet (40 mg total) by mouth daily.   [DISCONTINUED] dapagliflozin propanediol (FARXIGA) 10 MG TABS tablet Take 1 tablet (10 mg total) by mouth daily before breakfast.   [DISCONTINUED] omeprazole (PRILOSEC) 20 MG capsule Take 1 capsule (20 mg total) by mouth 2 (two) times daily   [DISCONTINUED] simvastatin (ZOCOR) 40 MG tablet Take 1 tablet (40 mg total) by mouth at bedtime.   [DISCONTINUED] tamsulosin (FLOMAX) 0.4 MG CAPS capsule Take 1 capsule (0.4 mg total) by mouth daily.   [DISCONTINUED] traMADol (ULTRAM) 50 MG tablet Take 1-2 tablets (50-100 mg total) by mouth every 6 (six) hours as needed.   [DISCONTINUED] tadalafil (CIALIS) 10 MG tablet Take 1 tablet (10 mg total) by mouth daily. (Patient not taking: Reported on 10/07/2021)   No facility-administered medications prior to visit.    Review of Systems  Last CBC Lab Results  Component Value Date   WBC 6.4 09/14/2020   HGB 12.9 (L) 09/14/2020   HCT 37.8 09/14/2020   MCV 91 09/14/2020   MCH 31.0 09/14/2020   RDW 13.0 09/14/2020   PLT 369 26/33/3545   Last metabolic panel Lab Results  Component Value Date   GLUCOSE 92 03/17/2021   NA 137 03/17/2021   K 4.8 03/17/2021   CL 103 03/17/2021   CO2 21 03/17/2021   BUN 35 (H) 03/17/2021   CREATININE 1.97 (H) 03/17/2021   EGFR 37 (L)  03/17/2021   CALCIUM 9.5 03/17/2021   PROT 6.7 03/17/2021   ALBUMIN 4.6 03/17/2021   LABGLOB 2.1 03/17/2021   AGRATIO 2.2 03/17/2021   BILITOT 0.3 03/17/2021   ALKPHOS 66 03/17/2021   AST 17 03/17/2021   ALT 16 03/17/2021   ANIONGAP 11 01/06/2018   Last lipids Lab Results  Component Value Date   CHOL 270 (H) 03/17/2021   HDL 51 03/17/2021   LDLCALC 186 (H) 03/17/2021   TRIG 178 (H) 03/17/2021   CHOLHDL 5.3 (H) 03/17/2021   Last hemoglobin A1c Lab Results  Component Value Date   HGBA1C 5.4 03/17/2021   Last thyroid functions Lab Results  Component Value Date   TSH 1.700 01/27/2020     Objective    BP 113/88 (BP Location: Right Arm, Patient Position: Sitting, Cuff Size: Normal)   Pulse 92   Temp 98.2 F (36.8 C) (Oral)   Resp 16   Wt 220 lb (99.8 kg)   SpO2 98%   BMI 30.26 kg/m   BP Readings from Last 3 Encounters:  10/07/21 113/88  06/29/21 100/64  03/17/21 112/80   Wt Readings from Last 3 Encounters:  10/07/21 220 lb (99.8 kg)  06/29/21 222 lb 9.6 oz (101 kg)  03/17/21 222 lb (100.7 kg)   SpO2 Readings from Last 3 Encounters:  10/07/21 98%  06/29/21 90%  03/17/21 98%   Physical Exam Vitals and nursing note reviewed.  Constitutional:      Appearance: Normal appearance. He is normal weight.  HENT:     Head: Normocephalic and atraumatic.  Eyes:     Pupils: Pupils are equal, round, and reactive to light.  Cardiovascular:     Rate and Rhythm: Normal rate and regular rhythm.     Pulses: Normal pulses.     Heart sounds: Normal heart sounds.  Pulmonary:     Effort: Pulmonary effort is normal.     Breath sounds: Normal breath sounds.  Musculoskeletal:        General: Normal range of motion.     Cervical back: Normal range of motion.  Skin:    General: Skin is warm and dry.     Capillary Refill: Capillary refill takes less than 2 seconds.  Neurological:     General: No focal deficit present.     Mental Status: He is alert and oriented to person,  place, and time. Mental status is at baseline.  Psychiatric:        Mood and Affect: Mood normal.        Behavior: Behavior normal.        Thought Content: Thought content normal.        Judgment: Judgment normal.     No results found for any visits on 10/07/21.  Assessment & Plan     Problem List Items Addressed This Visit       Cardiovascular and Mediastinum   Combined arterial insufficiency and corporo-venous occlusive erectile dysfunction  Chronic, stable c/b BPH Continue dual medication       Relevant Medications   benazepril (LOTENSIN) 40 MG tablet   simvastatin (ZOCOR) 40 MG tablet   tadalafil (CIALIS) 10 MG tablet   Hypertension    Chronic, stable <140/<90 Continue benazepril 40 mg; continue farxiga 10 mg       Relevant Medications   benazepril (LOTENSIN) 40 MG tablet   simvastatin (ZOCOR) 40 MG tablet   tadalafil (CIALIS) 10 MG tablet   Other Relevant Orders   Comprehensive Metabolic Panel (CMET)   Lipid panel   CBC with Differential/Platelet     Digestive   Acid reflux    Chronic, stable Continue prilosec 20 mg bid       Relevant Medications   omeprazole (PRILOSEC) 20 MG capsule     Genitourinary   Stage 3b chronic kidney disease (HCC) - Primary    Chronic, previously stable Repeat CMP On Farziga at 10 mg and Ace, benazepril 40 mg       Relevant Orders   Comprehensive Metabolic Panel (CMET)     Other   Chronic low back pain    Chronic, stable Wishes to continue medication to assist PT is aware of risks of opioid medication use to include increased sedation, respiratory suppression, falls, dependence and cardiovascular events. PT would like to continue treatment as benefit determined to outweigh risk.         Relevant Medications   traMADol (ULTRAM) 50 MG tablet   Chronic pain of right knee    Chronic, stable Continue daily medications TID PRN to assist       Relevant Medications   traMADol (ULTRAM) 50 MG tablet   RESOLVED:  Elevated PSA   Elevated PSA, between 10 and less than 20 ng/ml    Check elevation >10 Repeat Labs On dual agent therapies for BPH Refer to urology if symptoms change/worsen      Relevant Medications   tamsulosin (FLOMAX) 0.4 MG CAPS capsule   Other Relevant Orders   PSA   Hyperlipidemia    Chronic, previously elevated at 186 Declined use of statin On CoQ10 300 mg BID I recommend diet low in saturated fat and regular exercise - 30 min at least 5 times per week       Relevant Medications   benazepril (LOTENSIN) 40 MG tablet   simvastatin (ZOCOR) 40 MG tablet   tadalafil (CIALIS) 10 MG tablet     Return in about 6 months (around 04/07/2022) for chonic disease management.      Vonna Kotyk, FNP, have reviewed all documentation for this visit. The documentation on 10/07/21 for the exam, diagnosis, procedures, and orders are all accurate and complete.    Gwyneth Sprout, [place] [phone] (phone) [phone] (fax)  [place]

## 2021-10-07 ENCOUNTER — Encounter: Payer: Self-pay | Admitting: Family Medicine

## 2021-10-07 ENCOUNTER — Ambulatory Visit: Payer: Managed Care, Other (non HMO) | Admitting: Family Medicine

## 2021-10-07 VITALS — BP 113/88 | HR 92 | Temp 98.2°F | Resp 16 | Wt 220.0 lb

## 2021-10-07 DIAGNOSIS — M545 Low back pain, unspecified: Secondary | ICD-10-CM

## 2021-10-07 DIAGNOSIS — I10 Essential (primary) hypertension: Secondary | ICD-10-CM

## 2021-10-07 DIAGNOSIS — R972 Elevated prostate specific antigen [PSA]: Secondary | ICD-10-CM | POA: Diagnosis not present

## 2021-10-07 DIAGNOSIS — M25561 Pain in right knee: Secondary | ICD-10-CM

## 2021-10-07 DIAGNOSIS — N5203 Combined arterial insufficiency and corporo-venous occlusive erectile dysfunction: Secondary | ICD-10-CM

## 2021-10-07 DIAGNOSIS — N1832 Chronic kidney disease, stage 3b: Secondary | ICD-10-CM

## 2021-10-07 DIAGNOSIS — K219 Gastro-esophageal reflux disease without esophagitis: Secondary | ICD-10-CM

## 2021-10-07 DIAGNOSIS — E785 Hyperlipidemia, unspecified: Secondary | ICD-10-CM

## 2021-10-07 DIAGNOSIS — G8929 Other chronic pain: Secondary | ICD-10-CM

## 2021-10-07 MED ORDER — TADALAFIL 10 MG PO TABS: 10.0000 mg | ORAL_TABLET | Freq: Every day | ORAL | 3 refills | Status: DC

## 2021-10-07 MED ORDER — DAPAGLIFLOZIN PROPANEDIOL 10 MG PO TABS: 10.0000 mg | ORAL_TABLET | Freq: Every day | ORAL | 3 refills | Status: DC

## 2021-10-07 MED ORDER — SIMVASTATIN 40 MG PO TABS
40.0000 mg | ORAL_TABLET | Freq: Every day | ORAL | 3 refills | Status: DC
Start: 2021-10-07 — End: 2022-09-11

## 2021-10-07 MED ORDER — TRAMADOL HCL 50 MG PO TABS: 50.0000 mg | ORAL_TABLET | Freq: Four times a day (QID) | ORAL | 5 refills | Status: DC | PRN

## 2021-10-07 MED ORDER — BENAZEPRIL HCL 40 MG PO TABS: 40.0000 mg | ORAL_TABLET | Freq: Every day | ORAL | 3 refills | Status: DC

## 2021-10-07 MED ORDER — OMEPRAZOLE 20 MG PO CPDR: 20.0000 mg | DELAYED_RELEASE_CAPSULE | Freq: Two times a day (BID) | ORAL | 3 refills | Status: DC

## 2021-10-07 MED ORDER — TAMSULOSIN HCL 0.4 MG PO CAPS: 0.4000 mg | ORAL_CAPSULE | Freq: Every day | ORAL | 3 refills | Status: DC

## 2021-10-07 NOTE — Assessment & Plan Note (Signed)
Chronic, previously elevated at 186 Declined use of statin On CoQ10 300 mg BID I recommend diet low in saturated fat and regular exercise - 30 min at least 5 times per week

## 2021-10-07 NOTE — Assessment & Plan Note (Signed)
Chronic, stable <140/<90 Continue benazepril 40 mg; continue farxiga 10 mg

## 2021-10-07 NOTE — Assessment & Plan Note (Signed)
Chronic, stable c/b BPH Continue dual medication

## 2021-10-07 NOTE — Assessment & Plan Note (Signed)
Chronic, stable Continue prilosec 20 mg bid

## 2021-10-07 NOTE — Assessment & Plan Note (Signed)
Chronic, stable Wishes to continue medication to assist PT is aware of risks of opioid medication use to include increased sedation, respiratory suppression, falls, dependence and cardiovascular events. PT would like to continue treatment as benefit determined to outweigh risk.

## 2021-10-07 NOTE — Assessment & Plan Note (Signed)
Check elevation >10 Repeat Labs On dual agent therapies for BPH Refer to urology if symptoms change/worsen

## 2021-10-07 NOTE — Assessment & Plan Note (Signed)
Chronic, stable Continue daily medications TID PRN to assist

## 2021-10-07 NOTE — Assessment & Plan Note (Signed)
Chronic, previously stable Repeat CMP On Farziga at 10 mg and Ace, benazepril 40 mg

## 2021-10-08 LAB — CBC WITH DIFFERENTIAL/PLATELET
Basophils Absolute: 0 10*3/uL (ref 0.0–0.2)
Basos: 0 %
EOS (ABSOLUTE): 0.2 10*3/uL (ref 0.0–0.4)
Eos: 3 %
Hematocrit: 39.1 % (ref 37.5–51.0)
Hemoglobin: 12.9 g/dL — ABNORMAL LOW (ref 13.0–17.7)
Immature Grans (Abs): 0 10*3/uL (ref 0.0–0.1)
Immature Granulocytes: 0 %
Lymphocytes Absolute: 1.4 10*3/uL (ref 0.7–3.1)
Lymphs: 16 %
MCH: 30.9 pg (ref 26.6–33.0)
MCHC: 33 g/dL (ref 31.5–35.7)
MCV: 94 fL (ref 79–97)
Monocytes Absolute: 0.6 10*3/uL (ref 0.1–0.9)
Monocytes: 7 %
Neutrophils Absolute: 6.2 10*3/uL (ref 1.4–7.0)
Neutrophils: 74 %
Platelets: 351 10*3/uL (ref 150–450)
RBC: 4.17 x10E6/uL (ref 4.14–5.80)
RDW: 13.4 % (ref 11.6–15.4)
WBC: 8.4 10*3/uL (ref 3.4–10.8)

## 2021-10-08 LAB — COMPREHENSIVE METABOLIC PANEL
ALT: 12 IU/L (ref 0–44)
AST: 17 IU/L (ref 0–40)
Albumin/Globulin Ratio: 2.1 (ref 1.2–2.2)
Albumin: 4.5 g/dL (ref 3.9–4.9)
Alkaline Phosphatase: 67 IU/L (ref 44–121)
BUN/Creatinine Ratio: 16 (ref 10–24)
BUN: 28 mg/dL — ABNORMAL HIGH (ref 8–27)
Bilirubin Total: <0.2 mg/dL (ref 0.0–1.2)
CO2: 20 mmol/L (ref 20–29)
Calcium: 9.7 mg/dL (ref 8.6–10.2)
Chloride: 103 mmol/L (ref 96–106)
Creatinine, Ser: 1.78 mg/dL — ABNORMAL HIGH (ref 0.76–1.27)
Globulin, Total: 2.1 g/dL (ref 1.5–4.5)
Glucose: 81 mg/dL (ref 70–99)
Potassium: 4.5 mmol/L (ref 3.5–5.2)
Sodium: 137 mmol/L (ref 134–144)
Total Protein: 6.6 g/dL (ref 6.0–8.5)
eGFR: 42 mL/min/{1.73_m2} — ABNORMAL LOW (ref >59)

## 2021-10-08 LAB — LIPID PANEL
Chol/HDL Ratio: 5 ratio (ref 0.0–5.0)
Cholesterol, Total: 246 mg/dL — ABNORMAL HIGH (ref 100–199)
HDL: 49 mg/dL (ref >39)
LDL Chol Calc (NIH): 149 mg/dL — ABNORMAL HIGH (ref 0–99)
Triglycerides: 261 mg/dL — ABNORMAL HIGH (ref 0–149)
VLDL Cholesterol Cal: 48 mg/dL — ABNORMAL HIGH (ref 5–40)

## 2021-10-08 LAB — PSA: Prostate Specific Ag, Serum: 13.4 ng/mL — ABNORMAL HIGH (ref 0.0–4.0)

## 2021-10-08 NOTE — Progress Notes (Signed)
Hi Peter [name],  Blood chemistry continues to show stable decrease in kidney function. Ensure adequate hydration with water, goal of 64 oz/day. Continue to recommend follow up with specialist if desired, a nephrologist.  Cholesterol is slightly improved; however, remains elevated. I know you do not want to use statin medications. I recommend diet low in saturated fat and regular exercise - 30 min at least 5 times per week. Could also try Zetia, a non statin medication and/or fish oil to assist. The 10-year ASCVD risk score (Arnett DK, et al., 2019) is: 13.1%   Values used to calculate the score:     Age: 66 years     Sex: Male     Is Non-Hispanic African American: No     Diabetic: No     Tobacco smoker: No     Systolic Blood Pressure: 113 mmHg     Is BP treated: Yes     HDL Cholesterol: 49 mg/dL     Total Cholesterol: 246 mg/dL Heart attack and stroke risk is 13% estimated within the next 10 years which is moderate-high. Likely following the start of either product, your stroke and heart attack risk would decrease.  Cell count shows stable, slight anemia.  PSA has increased; recommend follow up with urology to ensure this is a benign process.  Take care of yourself and the pups!   Jacky Kindle, FNP  [facility] [address] [place], [place] [address] [phone] (phone) [phone] (fax) [facility]

## 2021-10-12 ENCOUNTER — Other Ambulatory Visit: Payer: Self-pay | Admitting: Family Medicine

## 2021-10-12 DIAGNOSIS — N1832 Chronic kidney disease, stage 3b: Secondary | ICD-10-CM

## 2021-10-14 ENCOUNTER — Telehealth: Payer: Self-pay

## 2021-10-14 NOTE — Progress Notes (Signed)
[facility])  [facility] Quality Pharmacy Team    10/14/2021  EWALD BEG Nov 09, 1955 [id number]  Reason for referral:  Pharmacy assistance needed given CKD  Referral source: Merita Norton, FNP Current insurance: Valinda Hoar [facility]   Outreach:  Unsuccessful telephone call with Mr. Bordelon, HIPAA compliant voicemail left.   Subjective:  Patient was referred to [facility] pharmacy for "pharm assistance needed given CKD." No specific pharmacy needs were specified in the referral, will aim to clarify with the patient.      Plan: Will contact patient again on Monday regarding unspecified pharmacy needs.   Thanks,  Harlon Flor, PharmD Clinical Pharmacist  [facility] [employer] [phone]

## 2021-10-17 NOTE — Progress Notes (Signed)
[facility])  [facility] Quality Pharmacy Team    10/17/2021  Peter [name] 1955-08-15 [id number]  Reason for referral:  Pharmacy assistance needed given CKD  Referral source: Merita Norton, FNP Current insurance: Valinda Hoar [facility]   Outreach:  Unsuccessful telephone call with [name], HIPAA compliant voicemail left, 2nd attempt.   Subjective:  Patient was referred to [facility] pharmacy for "pharm assistance needed given CKD." No specific pharmacy needs were specified in the referral, will aim to clarify with the patient.      Plan: Will contact patient again in 1- 3 business days regarding unspecified pharmacy needs.   Thanks,  Harlon Flor, PharmD Clinical Pharmacist  [facility] [employer] [phone]

## 2021-10-17 NOTE — Progress Notes (Signed)
[facility] Customer service manager [facility])  [facility] Quality Pharmacy Team    10/17/2021  [place] ANTOLIN Aug 11, 1955 [id number]  Reason for referral:  Pharmacy assistance needed given CKD  Referral source: Merita Norton, FNP Current insurance: Valinda Hoar [facility]   Outreach:  Successful telephone call with Mr. Mcglone.    Subjective:  Patient was referred to [facility] pharmacy for "pharm assistance needed given CKD." I spoke with the patient and he was unaware of any specific pharmacy needs. I provided education on CKD and Farxiga, and reassured him that he is on the appropriate dose for that indication.   The patient reported no financial barriers to any of his medications. I reviewed all of his medications to assess appropriate renal dosing. I sent a message to the PCP for any dosage adjustment recommendations.      Plan: Will close out referral case as the patient reports no issues and medication and disease state education were provided.  Thanks,  Harlon Flor, PharmD Clinical Pharmacist  [facility] [employer] [phone]

## 2021-10-19 ENCOUNTER — Other Ambulatory Visit: Payer: Self-pay | Admitting: Family Medicine

## 2021-10-19 DIAGNOSIS — N5203 Combined arterial insufficiency and corporo-venous occlusive erectile dysfunction: Secondary | ICD-10-CM

## 2021-10-19 MED ORDER — TADALAFIL 10 MG PO TABS: 5.0000 mg | ORAL_TABLET | Freq: Every day | ORAL | 3 refills | Status: DC

## 2021-12-15 ENCOUNTER — Other Ambulatory Visit: Payer: Self-pay | Admitting: Nephrology

## 2021-12-15 DIAGNOSIS — N1832 Chronic kidney disease, stage 3b: Secondary | ICD-10-CM

## 2021-12-15 DIAGNOSIS — I129 Hypertensive chronic kidney disease with stage 1 through stage 4 chronic kidney disease, or unspecified chronic kidney disease: Secondary | ICD-10-CM

## 2021-12-26 ENCOUNTER — Ambulatory Visit: Payer: Managed Care, Other (non HMO)

## 2022-03-20 ENCOUNTER — Ambulatory Visit: Payer: Managed Care, Other (non HMO) | Admitting: Family Medicine

## 2022-03-20 ENCOUNTER — Encounter: Payer: Self-pay | Admitting: Family Medicine

## 2022-03-20 VITALS — BP 123/84 | HR 93 | Temp 98.1°F | Ht 71.5 in | Wt 225.9 lb

## 2022-03-20 DIAGNOSIS — E785 Hyperlipidemia, unspecified: Secondary | ICD-10-CM

## 2022-03-20 DIAGNOSIS — G8929 Other chronic pain: Secondary | ICD-10-CM

## 2022-03-20 DIAGNOSIS — N5203 Combined arterial insufficiency and corporo-venous occlusive erectile dysfunction: Secondary | ICD-10-CM

## 2022-03-20 DIAGNOSIS — Z1211 Encounter for screening for malignant neoplasm of colon: Secondary | ICD-10-CM

## 2022-03-20 DIAGNOSIS — M25561 Pain in right knee: Secondary | ICD-10-CM

## 2022-03-20 DIAGNOSIS — I10 Essential (primary) hypertension: Secondary | ICD-10-CM | POA: Diagnosis not present

## 2022-03-20 DIAGNOSIS — N1832 Chronic kidney disease, stage 3b: Secondary | ICD-10-CM | POA: Diagnosis not present

## 2022-03-20 DIAGNOSIS — K219 Gastro-esophageal reflux disease without esophagitis: Secondary | ICD-10-CM

## 2022-03-20 DIAGNOSIS — R972 Elevated prostate specific antigen [PSA]: Secondary | ICD-10-CM

## 2022-03-20 MED ORDER — BENAZEPRIL HCL 40 MG PO TABS: 40.0000 mg | ORAL_TABLET | Freq: Every day | ORAL | 3 refills | Status: DC

## 2022-03-20 MED ORDER — OMEPRAZOLE 20 MG PO CPDR: 20.0000 mg | DELAYED_RELEASE_CAPSULE | Freq: Two times a day (BID) | ORAL | 3 refills | Status: DC

## 2022-03-20 MED ORDER — TAMSULOSIN HCL 0.4 MG PO CAPS: 0.4000 mg | ORAL_CAPSULE | Freq: Every day | ORAL | 3 refills | Status: DC

## 2022-03-20 MED ORDER — TRAMADOL HCL 50 MG PO TABS: 100.0000 mg | ORAL_TABLET | Freq: Four times a day (QID) | ORAL | 5 refills | Status: DC

## 2022-03-20 MED ORDER — TADALAFIL 10 MG PO TABS: 5.0000 mg | ORAL_TABLET | Freq: Every day | ORAL | 3 refills | Status: DC

## 2022-03-20 MED ORDER — DAPAGLIFLOZIN PROPANEDIOL 10 MG PO TABS: 10.0000 mg | ORAL_TABLET | Freq: Every day | ORAL | 3 refills | Status: DC

## 2022-03-20 NOTE — Assessment & Plan Note (Signed)
Chronic, stable Continue benazepril 40 mg

## 2022-03-20 NOTE — Progress Notes (Signed)
Established patient visit   Patient: Peter [name]   DOB: September 12, 1955   67 y.o. Male  MRN: [id number] Visit Date: 03/20/2022  Today's healthcare provider: Gwyneth Sprout, FNP  Introduced to nurse practitioner role and practice setting.  All questions answered.  Discussed provider/patient relationship and expectations.  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   Medications: Outpatient Medications Prior to Visit  Medication Sig   acetaminophen (TYLENOL) 500 MG tablet Take 500 mg by mouth every 8 (eight) hours as needed.   aspirin EC 81 MG tablet Take 81 mg by mouth daily. Swallow whole.   potassium chloride (KLOR-CON) 10 MEQ tablet Take by mouth.   vitamin C (ASCORBIC ACID) 500 MG tablet Take 1,000 mg by mouth daily.   [DISCONTINUED] benazepril (LOTENSIN) 40 MG tablet Take 1 tablet (40 mg total) by mouth daily.   [DISCONTINUED] dapagliflozin propanediol (FARXIGA) 10 MG TABS tablet Take 1 tablet (10 mg total) by mouth daily before breakfast.   [DISCONTINUED] omeprazole (PRILOSEC) 20 MG capsule Take 1 capsule (20 mg total) by mouth 2 (two) times daily before a meal.   [DISCONTINUED] tadalafil (CIALIS) 10 MG tablet Take 0.5 tablets (5 mg total) by mouth daily.   [DISCONTINUED] tamsulosin (FLOMAX) 0.4 MG CAPS capsule Take 1 capsule (0.4 mg total) by mouth daily.   [DISCONTINUED] traMADol (ULTRAM) 50 MG tablet Take 1-2 tablets (50-100 mg total) by mouth every 6 (six) hours as needed.   Coenzyme Q10 100 MG capsule Take 3 capsules (300 mg total) by mouth 2 (two) times daily. (Patient not taking: Reported on 03/20/2022)   simvastatin (ZOCOR) 40 MG tablet Take 1 tablet (40 mg total) by mouth at bedtime. (Patient not taking: Reported on 03/20/2022)   No facility-administered medications prior to visit.    Review of Systems     Objective    BP 123/84   Pulse 93   Temp 98.1 F (36.7 C)   Ht 5' 11.5" (1.816  m)   Wt 225 lb 14.4 oz (102.5 kg)   SpO2 97%   BMI 31.07 kg/m    Physical Exam Vitals and nursing note reviewed.  Constitutional:      Appearance: Normal appearance. He is obese.  HENT:     Head: Normocephalic and atraumatic.  Eyes:     Pupils: Pupils are equal, round, and reactive to light.  Cardiovascular:     Rate and Rhythm: Normal rate and regular rhythm.     Pulses: Normal pulses.     Heart sounds: Normal heart sounds.  Pulmonary:     Effort: Pulmonary effort is normal.     Breath sounds: Normal breath sounds.  Musculoskeletal:        General: Normal range of motion.     Cervical back: Normal range of motion.  Skin:    General: Skin is warm and dry.     Capillary Refill: Capillary refill takes less than 2 seconds.  Neurological:     General: No focal deficit present.     Mental Status: He is alert and oriented to person, place, and time. Mental status is at baseline.  Psychiatric:        Mood and Affect: Mood normal.        Behavior: Behavior normal.        Thought Content: Thought content normal.        Judgment: Judgment normal.  No results found for any visits on 03/20/22.  Assessment & Plan     Problem List Items Addressed This Visit       Cardiovascular and Mediastinum   Combined arterial insufficiency and corporo-venous occlusive erectile dysfunction    Chronic, stable Wishes to continue medication to assist       Relevant Medications   benazepril (LOTENSIN) 40 MG tablet   tadalafil (CIALIS) 10 MG tablet   Hypertension    Chronic, stable Continue benazepril 40 mg       Relevant Medications   benazepril (LOTENSIN) 40 MG tablet   dapagliflozin propanediol (FARXIGA) 10 MG TABS tablet   tadalafil (CIALIS) 10 MG tablet   Other Relevant Orders   Comprehensive Metabolic Panel (CMET)   CBC     Digestive   Acid reflux    Chronic, stable Wishes to continue prilosec to assist       Relevant Medications   omeprazole (PRILOSEC) 20 MG capsule      Genitourinary   Stage 3b chronic kidney disease (HCC) - Primary    Chronic, stable Repeat CMP Avoid NSAIDs- re-educated F/b nephrology       Relevant Medications   dapagliflozin propanediol (FARXIGA) 10 MG TABS tablet   Other Relevant Orders   Comprehensive Metabolic Panel (CMET)     Other   Chronic pain of right knee    Chronic, worsening Continues to work- wishes to increase tramadol to 100 mg from 50-100 mg to assist PDMP reviewed      Relevant Medications   traMADol (ULTRAM) 50 MG tablet   RESOLVED: Colon cancer screening   Elevated PSA   Relevant Orders   PSA   Elevated PSA, between 10 and less than 20 ng/ml   Relevant Medications   tamsulosin (FLOMAX) 0.4 MG CAPS capsule   Hyperlipidemia    Chronic, stable recommend diet low in saturated fat and regular exercise - 30 min at least 5 times per week       Relevant Medications   benazepril (LOTENSIN) 40 MG tablet   tadalafil (CIALIS) 10 MG tablet   Other Relevant Orders   Lipid panel   Return in about 6 months (around 09/18/2022) for chonic disease management.     Vonna Kotyk, FNP, have reviewed all documentation for this visit. The documentation on 03/20/22 for the exam, diagnosis, procedures, and orders are all accurate and complete.  Gwyneth Sprout, [place] [phone] (phone) [phone] (fax)  [place]

## 2022-03-20 NOTE — Assessment & Plan Note (Signed)
Chronic, stable Wishes to continue prilosec to assist

## 2022-03-20 NOTE — Assessment & Plan Note (Signed)
Chronic, stable Repeat CMP Avoid NSAIDs- re-educated F/b nephrology

## 2022-03-20 NOTE — Assessment & Plan Note (Signed)
Chronic, worsening Continues to work- wishes to increase tramadol to 100 mg from 50-100 mg to assist PDMP reviewed

## 2022-03-20 NOTE — Patient Instructions (Signed)
The CDC recommends two doses of Shingrix (the new shingles vaccine) separated by 2 to 6 months for adults age 65 years and older. I recommend checking with your insurance plan regarding coverage for this vaccine.    

## 2022-03-20 NOTE — Assessment & Plan Note (Signed)
Chronic, stable Wishes to continue medication to assist

## 2022-03-20 NOTE — Assessment & Plan Note (Signed)
Chronic, stable recommend diet low in saturated fat and regular exercise - 30 min at least 5 times per week

## 2022-03-21 ENCOUNTER — Telehealth: Payer: Self-pay

## 2022-03-21 LAB — CBC
Hematocrit: 39 % (ref 37.5–51.0)
Hemoglobin: 13.1 g/dL (ref 13.0–17.7)
MCH: 31.3 pg (ref 26.6–33.0)
MCHC: 33.6 g/dL (ref 31.5–35.7)
MCV: 93 fL (ref 79–97)
Platelets: 367 10*3/uL (ref 150–450)
RBC: 4.18 x10E6/uL (ref 4.14–5.80)
RDW: 13.1 % (ref 11.6–15.4)
WBC: 7.1 10*3/uL (ref 3.4–10.8)

## 2022-03-21 LAB — LIPID PANEL
Chol/HDL Ratio: 4.9 ratio (ref 0.0–5.0)
Cholesterol, Total: 265 mg/dL — ABNORMAL HIGH (ref 100–199)
HDL: 54 mg/dL (ref >39)
LDL Chol Calc (NIH): 162 mg/dL — ABNORMAL HIGH (ref 0–99)
Triglycerides: 265 mg/dL — ABNORMAL HIGH (ref 0–149)
VLDL Cholesterol Cal: 49 mg/dL — ABNORMAL HIGH (ref 5–40)

## 2022-03-21 LAB — COMPREHENSIVE METABOLIC PANEL
ALT: 13 IU/L (ref 0–44)
AST: 17 IU/L (ref 0–40)
Albumin/Globulin Ratio: 2 (ref 1.2–2.2)
Albumin: 4.4 g/dL (ref 3.9–4.9)
Alkaline Phosphatase: 75 IU/L (ref 44–121)
BUN/Creatinine Ratio: 15 (ref 10–24)
BUN: 26 mg/dL (ref 8–27)
Bilirubin Total: <0.2 mg/dL (ref 0.0–1.2)
CO2: 19 mmol/L — ABNORMAL LOW (ref 20–29)
Calcium: 9.8 mg/dL (ref 8.6–10.2)
Chloride: 106 mmol/L (ref 96–106)
Creatinine, Ser: 1.72 mg/dL — ABNORMAL HIGH (ref 0.76–1.27)
Globulin, Total: 2.2 g/dL (ref 1.5–4.5)
Glucose: 79 mg/dL (ref 70–99)
Potassium: 4.7 mmol/L (ref 3.5–5.2)
Sodium: 139 mmol/L (ref 134–144)
Total Protein: 6.6 g/dL (ref 6.0–8.5)
eGFR: 44 mL/min/{1.73_m2} — ABNORMAL LOW (ref >59)

## 2022-03-21 LAB — PSA: Prostate Specific Ag, Serum: 11 ng/mL — ABNORMAL HIGH (ref 0.0–4.0)

## 2022-03-21 NOTE — Progress Notes (Signed)
Stable creatinine and eGFR. Continue to recommend combination of tramadol and tylenol to assist with joint aches/pains. Avoid NSAIDS like Ibuprofen and/or Aleve. Aspirin is OK- however, be mindful of bleeding risk.  Cholesterol is slightly elevated despite improved good/HDL cholesterol. Stroke and MI risk of 15% in 10 years; continue to recommend cholesterol medication "statin" to assist with risk reduction. Please let us know if you are willing to start. The 10-year ASCVD risk score (Arnett DK, et al., 2019) is: 15%   Values used to calculate the score:     Age: 67 years     Sex: Male     Is Non-Hispanic African American: No     Diabetic: No     Tobacco smoker: No     Systolic Blood Pressure: [id number] mmHg     Is BP treated: Yes     HDL Cholesterol: 54 mg/dL     Total Cholesterol: 265 mg/dL  Slight improvement in PSA; continue to monitor symptoms and reach out to urology for further assistance.  Stable cell counts.  Peter [name], [address] [place], [place] [address] [phone] (phone) [phone] (fax) [place]

## 2022-03-21 NOTE — Telephone Encounter (Signed)
-----   Message from Gwyneth Sprout, FNP sent at 03/21/2022  8:07 AM EST ----- Stable creatinine and eGFR. Continue to recommend combination of tramadol and tylenol to assist with joint aches/pains. Avoid NSAIDS like Ibuprofen and/or Aleve. Aspirin is OK- however, be mindful of bleeding risk.  Cholesterol is slightly elevated despite improved good/HDL cholesterol. Stroke and MI risk of 15% in 10 years; continue to recommend cholesterol medication "statin" to assist with risk reduction. Please let us know if you are willing to start. The 10-year ASCVD risk score (Arnett DK, et al., 2019) is: 15%   Values used to calculate the score:     Age: 7 years     Sex: Male     Is Non-Hispanic African American: No     Diabetic: No     Tobacco smoker: No     Systolic Blood Pressure: 888 mmHg     Is BP treated: Yes     HDL Cholesterol: 54 mg/dL     Total Cholesterol: 265 mg/dL  Slight improvement in PSA; continue to monitor symptoms and reach out to urology for further assistance.  Stable cell counts.  Gwyneth Sprout, [address] [place], [place] [address] [phone] (phone) [phone] (fax) [place]

## 2022-03-21 NOTE — Telephone Encounter (Signed)
I left a message for the patient to return my call.

## 2022-03-23 ENCOUNTER — Encounter: Payer: BC Managed Care – PPO | Admitting: Family Medicine

## 2022-05-23 ENCOUNTER — Other Ambulatory Visit: Payer: Self-pay | Admitting: Family Medicine

## 2022-05-23 DIAGNOSIS — L0390 Cellulitis, unspecified: Secondary | ICD-10-CM

## 2022-05-24 NOTE — Telephone Encounter (Signed)
Unable to refill per protocol, Rx expired. Discontinued 06/29/21.  Requested Prescriptions  Pending Prescriptions Disp Refills   mupirocin ointment (BACTROBAN) 2 % [Pharmacy Med Name: MUPIROCIN 2% OINTMENT] 22 g 0    Sig: Apply 1 application topically 2 (two) times daily.     Off-Protocol Failed - 05/23/2022  1:30 PM      Failed - Medication not assigned to a protocol, review manually.      Passed - Valid encounter within last 12 months    Recent Outpatient Visits           2 months ago Stage 3b chronic kidney disease [facility])   [place] [facility] Merita Norton T, FNP   7 months ago Stage 3b chronic kidney disease [facility])   [place] [facility] Jacky Kindle, FNP   10 months ago Encounter for examination required by Department of Transportation (DOT)   [place] [facility] Aura Dials T, NP   1 year ago Annual physical exam   [facility] Jacky Kindle, FNP   1 year ago Primary hypertension    [facility] [place], Marzella Schlein, MD

## 2022-06-30 ENCOUNTER — Ambulatory Visit: Payer: Managed Care, Other (non HMO) | Admitting: Family Medicine

## 2022-09-08 ENCOUNTER — Encounter: Payer: Self-pay | Admitting: Family Medicine

## 2022-09-08 ENCOUNTER — Telehealth: Payer: Self-pay | Admitting: Family Medicine

## 2022-09-08 ENCOUNTER — Ambulatory Visit: Payer: Managed Care, Other (non HMO) | Admitting: Family Medicine

## 2022-09-08 VITALS — BP 116/68 | HR 91 | Ht 72.0 in | Wt 210.3 lb

## 2022-09-08 DIAGNOSIS — R972 Elevated prostate specific antigen [PSA]: Secondary | ICD-10-CM

## 2022-09-08 DIAGNOSIS — E785 Hyperlipidemia, unspecified: Secondary | ICD-10-CM

## 2022-09-08 DIAGNOSIS — N1832 Chronic kidney disease, stage 3b: Secondary | ICD-10-CM | POA: Diagnosis not present

## 2022-09-08 MED ORDER — MUPIROCIN 2 % EX OINT: 1.0000 | TOPICAL_OINTMENT | Freq: Two times a day (BID) | CUTANEOUS | 11 refills | Status: DC

## 2022-09-08 NOTE — Assessment & Plan Note (Signed)
Chronic, previously elevated Declines use of statin Repeat LP recommend diet low in saturated fat and regular exercise - 30 min at least 5 times per week

## 2022-09-08 NOTE — Assessment & Plan Note (Signed)
Chronic, stable No complaints Repeat labs

## 2022-09-08 NOTE — Progress Notes (Signed)
Established patient visit   Patient: Peter [name]   DOB: 09-18-1955   67 y.o. Male  MRN: [id number] Visit Date: 09/08/2022  Today's healthcare provider: Jacky Kindle, FNP  Introduced to nurse practitioner role and practice setting.  All questions answered.  Discussed provider/patient relationship and expectations.  Subjective    HPI HPI     Medical Management of Chronic Issues    Additional comments: Declined pneumonia vaccine      Last edited by Acey Lav, CMA on 09/08/2022  3:47 PM.      Medications: Outpatient Medications Prior to Visit  Medication Sig   acetaminophen (TYLENOL) 500 MG tablet Take 500 mg by mouth every 8 (eight) hours as needed.   aspirin EC 81 MG tablet Take 81 mg by mouth daily. Swallow whole.   benazepril (LOTENSIN) 40 MG tablet Take 1 tablet (40 mg total) by mouth daily.   Coenzyme Q10 100 MG capsule Take 3 capsules (300 mg total) by mouth 2 (two) times daily.   dapagliflozin propanediol (FARXIGA) 10 MG TABS tablet Take 1 tablet (10 mg total) by mouth daily before breakfast.   omeprazole (PRILOSEC) 20 MG capsule Take 1 capsule (20 mg total) by mouth 2 (two) times daily before a meal.   potassium chloride (KLOR-CON) 10 MEQ tablet Take by mouth.   simvastatin (ZOCOR) 40 MG tablet Take 1 tablet (40 mg total) by mouth at bedtime.   tadalafil (CIALIS) 10 MG tablet Take 0.5 tablets (5 mg total) by mouth daily.   tamsulosin (FLOMAX) 0.4 MG CAPS capsule Take 1 capsule (0.4 mg total) by mouth daily.   traMADol (ULTRAM) 50 MG tablet Take 2 tablets (100 mg total) by mouth in the morning, at noon, in the evening, and at bedtime.   vitamin C (ASCORBIC ACID) 500 MG tablet Take 1,000 mg by mouth daily.   No facility-administered medications prior to visit.    Review of Systems    Objective    BP 116/68 (BP Location: Left Arm, Patient Position: Sitting) Comment: manual  Pulse 91   Ht 6' (1.829 m)   Wt 210 lb 4.8 oz (95.4 kg)   SpO2 96%   BMI 28.52  kg/m   Physical Exam Vitals and nursing note reviewed.  Constitutional:      Appearance: Normal appearance. He is overweight.  HENT:     Head: Normocephalic and atraumatic.  Cardiovascular:     Rate and Rhythm: Normal rate and regular rhythm.     Pulses: Normal pulses.     Heart sounds: Normal heart sounds.  Pulmonary:     Effort: Pulmonary effort is normal.     Breath sounds: Normal breath sounds.  Musculoskeletal:        General: Normal range of motion.     Cervical back: Normal range of motion.  Skin:    General: Skin is warm and dry.     Capillary Refill: Capillary refill takes less than 2 seconds.  Neurological:     General: No focal deficit present.     Mental Status: He is alert and oriented to person, place, and time. Mental status is at baseline.     Comments: Chronic knee and back pain; stable  Psychiatric:        Mood and Affect: Mood normal.        Behavior: Behavior normal.        Thought Content: Thought content normal.        Judgment: Judgment normal.  No results found for any visits on 09/08/22.  Assessment & Plan     Problem List Items Addressed This Visit       Genitourinary   Stage 3b chronic kidney disease (HCC) - Primary    Chronic, remains on benazepril 40 mg to assist CKD as well as BP Continue to monitor MP Previously has seen nephrology; not interested in return visit at this time       Relevant Orders   Basic Metabolic Panel (BMET)     Other   Elevated PSA    Chronic, stable No complaints Repeat labs      Relevant Orders   PSA   Hyperlipidemia    Chronic, previously elevated Declines use of statin Repeat LP recommend diet low in saturated fat and regular exercise - 30 min at least 5 times per week       Relevant Orders   Lipid panel   Return if symptoms worsen or fail to improve, for annual examination.     Leilani Merl, FNP, have reviewed all documentation for this visit. The documentation on 09/08/22 for the  exam, diagnosis, procedures, and orders are all accurate and complete.  Jacky Kindle, FNP  [facility] [phone] (phone) [phone] (fax)  [facility]

## 2022-09-08 NOTE — Assessment & Plan Note (Signed)
Chronic, remains on benazepril 40 mg to assist CKD as well as BP Continue to monitor MP Previously has seen nephrology; not interested in return visit at this time

## 2022-09-09 NOTE — Progress Notes (Signed)
Labs indicate worsening kidney function; likely due to heat/hydration levels. Back up in 2's; previously 1.7-1.9. Cholesterol is slightly improved; although, remains elevated. The 10-year ASCVD risk score (Arnett DK, et al., 2019) is: 13.6% recommend diet low in saturated fat and regular exercise - 30 min at least 5 times per week  PSA is up 5.6%. Consider follow up with urology to assist with next steps.

## 2022-09-11 ENCOUNTER — Other Ambulatory Visit: Payer: Self-pay | Admitting: Family Medicine

## 2022-09-11 ENCOUNTER — Telehealth: Payer: Self-pay | Admitting: Family Medicine

## 2022-09-11 DIAGNOSIS — N1832 Chronic kidney disease, stage 3b: Secondary | ICD-10-CM

## 2022-09-11 DIAGNOSIS — R972 Elevated prostate specific antigen [PSA]: Secondary | ICD-10-CM

## 2022-09-11 DIAGNOSIS — G8929 Other chronic pain: Secondary | ICD-10-CM

## 2022-09-11 DIAGNOSIS — I10 Essential (primary) hypertension: Secondary | ICD-10-CM

## 2022-09-11 DIAGNOSIS — E785 Hyperlipidemia, unspecified: Secondary | ICD-10-CM

## 2022-09-11 DIAGNOSIS — N5203 Combined arterial insufficiency and corporo-venous occlusive erectile dysfunction: Secondary | ICD-10-CM

## 2022-09-11 DIAGNOSIS — K219 Gastro-esophageal reflux disease without esophagitis: Secondary | ICD-10-CM

## 2022-09-11 MED ORDER — COENZYME Q10 100 MG PO CAPS: 300.0000 mg | ORAL_CAPSULE | Freq: Two times a day (BID) | ORAL | 3 refills | Status: DC

## 2022-09-11 MED ORDER — TAMSULOSIN HCL 0.4 MG PO CAPS
0.4000 mg | ORAL_CAPSULE | Freq: Every day | ORAL | 3 refills | Status: DC
Start: 2022-09-11 — End: 2022-09-11

## 2022-09-11 MED ORDER — BENAZEPRIL HCL 40 MG PO TABS
40.0000 mg | ORAL_TABLET | Freq: Every day | ORAL | 3 refills | Status: DC
Start: 2022-09-11 — End: 2022-09-11

## 2022-09-11 MED ORDER — DAPAGLIFLOZIN PROPANEDIOL 5 MG PO TABS
10.0000 mg | ORAL_TABLET | Freq: Every day | ORAL | 3 refills | Status: DC
Start: 2022-09-11 — End: 2023-02-01

## 2022-09-11 MED ORDER — TAMSULOSIN HCL 0.4 MG PO CAPS
0.4000 mg | ORAL_CAPSULE | Freq: Every day | ORAL | 3 refills | Status: DC
Start: 2022-09-11 — End: 2023-02-01

## 2022-09-11 MED ORDER — ASPIRIN 81 MG PO TBEC: 81.0000 mg | DELAYED_RELEASE_TABLET | Freq: Every day | ORAL | 4 refills | Status: DC

## 2022-09-11 MED ORDER — DAPAGLIFLOZIN PROPANEDIOL 5 MG PO TABS
10.0000 mg | ORAL_TABLET | Freq: Every day | ORAL | 3 refills | Status: DC
Start: 2022-09-11 — End: 2022-09-11

## 2022-09-11 MED ORDER — TRAMADOL HCL 50 MG PO TABS: 100.0000 mg | ORAL_TABLET | Freq: Four times a day (QID) | ORAL | 5 refills | Status: DC

## 2022-09-11 MED ORDER — OMEPRAZOLE 20 MG PO CPDR
20.0000 mg | DELAYED_RELEASE_CAPSULE | Freq: Two times a day (BID) | ORAL | 3 refills | Status: DC
Start: 2022-09-11 — End: 2023-02-01

## 2022-09-11 MED ORDER — BENAZEPRIL HCL 40 MG PO TABS
40.0000 mg | ORAL_TABLET | Freq: Every day | ORAL | 3 refills | Status: DC
Start: 2022-09-11 — End: 2023-02-01

## 2022-09-11 MED ORDER — OMEPRAZOLE 20 MG PO CPDR
20.0000 mg | DELAYED_RELEASE_CAPSULE | Freq: Two times a day (BID) | ORAL | 3 refills | Status: DC
Start: 2022-09-11 — End: 2022-09-11

## 2022-09-11 MED ORDER — MUPIROCIN 2 % EX OINT: 1.0000 | TOPICAL_OINTMENT | Freq: Two times a day (BID) | CUTANEOUS | 11 refills | Status: DC

## 2022-09-11 MED ORDER — SIMVASTATIN 40 MG PO TABS
40.0000 mg | ORAL_TABLET | Freq: Every day | ORAL | 3 refills | Status: DC
Start: 2022-09-11 — End: 2022-09-13

## 2022-09-11 MED ORDER — TADALAFIL 10 MG PO TABS
5.0000 mg | ORAL_TABLET | Freq: Every day | ORAL | 3 refills | Status: DC
Start: 2022-09-11 — End: 2022-09-11

## 2022-09-11 MED ORDER — TADALAFIL 10 MG PO TABS
5.0000 mg | ORAL_TABLET | Freq: Every day | ORAL | 3 refills | Status: DC
Start: 2022-09-11 — End: 2022-09-13

## 2022-09-11 NOTE — Telephone Encounter (Signed)
Resend to [facility].

## 2022-09-11 NOTE — Telephone Encounter (Signed)
[facility] is requesting prior authorization Key: [id number] Name: Arner Dapagliflozin Propanediol 10mg  tablets has been rejected and requires PA

## 2022-09-11 NOTE — Telephone Encounter (Signed)
All the meds you refilled for this man today,  09/11/22,  went to the wrong pharmacy.  They need to be sent to [facility] please.    He is out and needs these ASAP.

## 2022-09-11 NOTE — Telephone Encounter (Signed)
Medication Refill - Medication: tadalafil (CIALIS) 10 MG tablet traMADol (ULTRAM) 50 MG tablet omeprazole FLOMAX) 0.4 MG CAPS capsule dapagliflozin propanediol (FARXIGA) 10 MG TABS tablet omeprazole (PRILOSEC) 20 MG capsule benazepril (LOTENSIN) 40 MG tablet   Medication called into the wrong pharmacy, it she be [facility]    Has the patient contacted their pharmacy? Yes.       Science writer (with phone number or street name):   [facility] - [place], [place] - [address]  [address] [place], [place] [address]  Phone:  [phone]  Fax:  [phone]   Has the patient been seen for an appointment in the last year OR does the patient have an upcoming appointment? Yes.    Agent: Please be advised that RX refills may take up to 3 business days. We ask that you follow-up with your pharmacy.

## 2022-09-11 NOTE — Telephone Encounter (Signed)
[facility] is requesting prior authorization Key: [id number] Name: Peter [name] 5mg  tablets has been rejected and requires PA

## 2022-09-13 ENCOUNTER — Ambulatory Visit: Payer: Self-pay

## 2022-09-13 ENCOUNTER — Other Ambulatory Visit: Payer: Self-pay | Admitting: Family Medicine

## 2022-09-13 DIAGNOSIS — N5203 Combined arterial insufficiency and corporo-venous occlusive erectile dysfunction: Secondary | ICD-10-CM

## 2022-09-13 MED ORDER — TADALAFIL 10 MG PO TABS
10.0000 mg | ORAL_TABLET | Freq: Every day | ORAL | 3 refills | Status: DC
Start: 2022-09-13 — End: 2022-09-19

## 2022-09-13 NOTE — Telephone Encounter (Signed)
ummary: Pt requests that a nurse return his call to discuss his medications.   Pt requests that a nurse return his call to discuss his medications. Cb#  [phone]     Chief Complaint: Pt. Reports Cialis dose has been changed from 1 pill daily to 1/2. States he "has always taken 1 pill." Also does not take Simvastatin anymore and Would like it removed from medication list. States he called the after hours number to leave PCP a message and was told Ms. Suzie Portela does not "work here." Would like her aware of this. Symptoms: n/a Frequency: n/a Pertinent Negatives: Patient denies  Disposition: [] ED /[] Urgent Care (no appt availability in office) / [] Appointment(In office/virtual)/ []  [place] [facility]/ [] Home Care/ [] Refused Recommended Disposition /[] [facility]/ []  Follow-up with PCP Additional Notes: Please advise pt.  Reason for Disposition  [1] Caller has URGENT medicine question about med that PCP or specialist prescribed AND [2] triager unable to answer question  Answer Assessment - Initial Assessment Questions 1. NAME of MEDICINE: "What medicine(s) are you calling about?"     Reports he is not taking Simvastatin. Cialis dose has been changed to 1/2 pill and he has been taking 1 pill. 2. QUESTION: "What is your question?" (e.g., double dose of medicine, side effect)     Above 3. PRESCRIBER: "Who prescribed the medicine?" Reason: if prescribed by specialist, call should be referred to that group.     [place] 4. SYMPTOMS: "Do you have any symptoms?" If Yes, ask: "What symptoms are you having?"  "How bad are the symptoms (e.g., mild, moderate, severe)     N/a 5. PREGNANCY:  "Is there any chance that you are pregnant?" "When was your last menstrual period?"     N/a  Protocols used: Medication Question Call-A-AH

## 2022-09-18 NOTE — Telephone Encounter (Signed)
Outcome N/A today by [facility] 2017 CaseId:[id number];Status:Cancelled;Explanation:PA not Required. The Prescribed limit is below the plan limit;Appeal Information:; Drug Farxiga 5MG  tablets

## 2022-09-18 NOTE — Telephone Encounter (Signed)
Closing this request. PA initiated today on a different encounter.

## 2022-09-18 NOTE — Telephone Encounter (Signed)
Patient stopped by the office to say he doesn't need 100 pills of Tadalafil and to please contact [facility] to reduce # to 30 with  refills please.    He isnt sure why 100 was sent in.

## 2022-09-18 NOTE — Telephone Encounter (Signed)
PA initiated

## 2022-09-19 ENCOUNTER — Other Ambulatory Visit: Payer: Self-pay | Admitting: Family Medicine

## 2022-09-19 DIAGNOSIS — N5203 Combined arterial insufficiency and corporo-venous occlusive erectile dysfunction: Secondary | ICD-10-CM

## 2022-09-19 MED ORDER — TADALAFIL 10 MG PO TABS
10.0000 mg | ORAL_TABLET | Freq: Every day | ORAL | 0 refills | Status: DC
Start: 2022-09-19 — End: 2023-02-01

## 2022-12-01 ENCOUNTER — Telehealth: Payer: Self-pay

## 2022-12-01 NOTE — Telephone Encounter (Signed)
Copied from CRM [id number]. Topic: General - Other >> Nov 13, 2022  9:50 AM Everette C wrote: Reason for CRM: The patient has called to request direct contact with Merrily Pew regarding a potential alternative for their prescription for dapagliflozin propanediol (FARXIGA) 5 MG TABS tablet [[id number]]  The patient has been directed by their pharmacy [facility] to request contact with their PCP to further discuss a potential alternative   Please contact further when possible

## 2022-12-06 NOTE — Telephone Encounter (Signed)
Patient was advised.  

## 2023-02-01 ENCOUNTER — Ambulatory Visit (INDEPENDENT_AMBULATORY_CARE_PROVIDER_SITE_OTHER): Payer: Managed Care, Other (non HMO) | Admitting: Family Medicine

## 2023-02-01 ENCOUNTER — Encounter: Payer: Self-pay | Admitting: Family Medicine

## 2023-02-01 VITALS — BP 107/79 | HR 85 | Temp 98.2°F | Ht 72.0 in | Wt 213.0 lb

## 2023-02-01 DIAGNOSIS — J0140 Acute pansinusitis, unspecified: Secondary | ICD-10-CM

## 2023-02-01 DIAGNOSIS — R7303 Prediabetes: Secondary | ICD-10-CM

## 2023-02-01 DIAGNOSIS — M545 Low back pain, unspecified: Secondary | ICD-10-CM

## 2023-02-01 DIAGNOSIS — K219 Gastro-esophageal reflux disease without esophagitis: Secondary | ICD-10-CM | POA: Diagnosis not present

## 2023-02-01 DIAGNOSIS — N1832 Chronic kidney disease, stage 3b: Secondary | ICD-10-CM

## 2023-02-01 DIAGNOSIS — Z0001 Encounter for general adult medical examination with abnormal findings: Secondary | ICD-10-CM

## 2023-02-01 DIAGNOSIS — Z0000 Encounter for general adult medical examination without abnormal findings: Secondary | ICD-10-CM

## 2023-02-01 DIAGNOSIS — M25561 Pain in right knee: Secondary | ICD-10-CM | POA: Diagnosis not present

## 2023-02-01 DIAGNOSIS — N5203 Combined arterial insufficiency and corporo-venous occlusive erectile dysfunction: Secondary | ICD-10-CM

## 2023-02-01 DIAGNOSIS — Z5320 Procedure and treatment not carried out because of patient's decision for unspecified reasons: Secondary | ICD-10-CM | POA: Insufficient documentation

## 2023-02-01 DIAGNOSIS — I10 Essential (primary) hypertension: Secondary | ICD-10-CM

## 2023-02-01 DIAGNOSIS — R972 Elevated prostate specific antigen [PSA]: Secondary | ICD-10-CM

## 2023-02-01 DIAGNOSIS — E782 Mixed hyperlipidemia: Secondary | ICD-10-CM

## 2023-02-01 DIAGNOSIS — G8929 Other chronic pain: Secondary | ICD-10-CM

## 2023-02-01 MED ORDER — ASPIRIN 81 MG PO TBEC: 81.0000 mg | DELAYED_RELEASE_TABLET | Freq: Every day | ORAL | 4 refills | Status: DC

## 2023-02-01 MED ORDER — COENZYME Q10 100 MG PO CAPS: 300.0000 mg | ORAL_CAPSULE | Freq: Two times a day (BID) | ORAL | 3 refills | Status: DC

## 2023-02-01 MED ORDER — DAPAGLIFLOZIN PROPANEDIOL 10 MG PO TABS: 10.0000 mg | ORAL_TABLET | Freq: Every day | ORAL | 3 refills | Status: DC

## 2023-02-01 MED ORDER — TRAMADOL HCL 50 MG PO TABS: 100.0000 mg | ORAL_TABLET | Freq: Four times a day (QID) | ORAL | 5 refills | Status: DC

## 2023-02-01 MED ORDER — TADALAFIL 10 MG PO TABS: 10.0000 mg | ORAL_TABLET | Freq: Every day | ORAL | 3 refills | Status: DC

## 2023-02-01 MED ORDER — BENAZEPRIL HCL 40 MG PO TABS: 40.0000 mg | ORAL_TABLET | Freq: Every day | ORAL | 3 refills | Status: DC

## 2023-02-01 MED ORDER — AMOXICILLIN-POT CLAVULANATE 875-125 MG PO TABS: 1.0000 | ORAL_TABLET | Freq: Two times a day (BID) | ORAL | 0 refills | Status: DC

## 2023-02-01 MED ORDER — TAMSULOSIN HCL 0.4 MG PO CAPS: 0.8000 mg | ORAL_CAPSULE | Freq: Every day | ORAL | 3 refills | Status: DC

## 2023-02-01 MED ORDER — OMEPRAZOLE 20 MG PO CPDR: 20.0000 mg | DELAYED_RELEASE_CAPSULE | Freq: Two times a day (BID) | ORAL | 3 refills | Status: DC

## 2023-02-01 MED ORDER — AMOXICILLIN-POT CLAVULANATE 875-125 MG PO TABS
1.0000 | ORAL_TABLET | Freq: Two times a day (BID) | ORAL | 0 refills | Status: DC
Start: 2023-02-01 — End: 2023-09-14

## 2023-02-01 MED ORDER — TRAMADOL HCL 50 MG PO TABS
100.0000 mg | ORAL_TABLET | Freq: Four times a day (QID) | ORAL | 5 refills | Status: DC
Start: 2023-02-01 — End: 2023-02-01

## 2023-02-01 NOTE — Assessment & Plan Note (Signed)
Repeat A1c; Continue to recommend balanced, lower carb meals. Smaller meal size, adding snacks. Choosing water as drink of choice and increasing purposeful exercise.

## 2023-02-01 NOTE — Assessment & Plan Note (Signed)
Chronic, well controlled with PRN tramadol Request for refills Declines chronic pain referral at this time

## 2023-02-01 NOTE — Assessment & Plan Note (Signed)
Chronic, stable Followed by nephro Repeat [facility] On ACEi and Farxiga

## 2023-02-01 NOTE — Assessment & Plan Note (Signed)
Chronic, stable Continue on prilosec 20 mg BID

## 2023-02-01 NOTE — Assessment & Plan Note (Signed)
Acute, failed conservative OTC treatment Request for Abx to assist Some things that can make you feel better are: - Increased rest - Increasing Fluids - Acetaminophen / ibuprofen as needed for fever/pain.  - Salt water gargling, chloraseptic spray and throat lozenges - OTC pseudoephedrine.  - Mucinex.  - Saline sinus flushes or a neti pot.  - Humidifying the air.
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
## 2023-02-01 NOTE — Assessment & Plan Note (Signed)
Chronic, stable Continue farxiga 10 as well as lotensin 40 mg

## 2023-02-01 NOTE — Assessment & Plan Note (Signed)
Chronic, previously elevated Has previously declined referral to urology

## 2023-02-01 NOTE — Assessment & Plan Note (Signed)
Chronic, elevated The 10-year ASCVD risk score (Arnett DK, et al., 2019) is: 11.9% I continue to recommend diet low in saturated fat and regular exercise - 30 min at least 5 times per week

## 2023-02-01 NOTE — Assessment & Plan Note (Signed)
Chronic, stable Elevated PSA; declined f/u with urology Remains on flomax 0.4 mg and Cialis 10 mg

## 2023-02-01 NOTE — Assessment & Plan Note (Signed)

## 2023-02-02 LAB — LIPID PANEL
Chol/HDL Ratio: 4.1 {ratio} (ref 0.0–5.0)
Cholesterol, Total: 209 mg/dL — ABNORMAL HIGH (ref 100–199)
HDL: 51 mg/dL (ref >39)
LDL Chol Calc (NIH): 123 mg/dL — ABNORMAL HIGH (ref 0–99)
Triglycerides: 201 mg/dL — ABNORMAL HIGH (ref 0–149)
VLDL Cholesterol Cal: 35 mg/dL (ref 5–40)

## 2023-02-02 LAB — HEMOGLOBIN A1C
Est. average glucose Bld gHb Est-mCnc: 111 mg/dL
Hgb A1c MFr Bld: 5.5 % (ref 4.8–5.6)

## 2023-02-02 LAB — CBC WITH DIFFERENTIAL/PLATELET
Basophils Absolute: 0 10*3/uL (ref 0.0–0.2)
Basos: 0 %
EOS (ABSOLUTE): 0.1 10*3/uL (ref 0.0–0.4)
Eos: 2 %
Hematocrit: 42.8 % (ref 37.5–51.0)
Hemoglobin: 14.5 g/dL (ref 13.0–17.7)
Immature Grans (Abs): 0 10*3/uL (ref 0.0–0.1)
Immature Granulocytes: 0 %
Lymphocytes Absolute: 1.2 10*3/uL (ref 0.7–3.1)
Lymphs: 23 %
MCH: 32.4 pg (ref 26.6–33.0)
MCHC: 33.9 g/dL (ref 31.5–35.7)
MCV: 96 fL (ref 79–97)
Monocytes Absolute: 0.6 10*3/uL (ref 0.1–0.9)
Monocytes: 12 %
Neutrophils Absolute: 3.2 10*3/uL (ref 1.4–7.0)
Neutrophils: 63 %
Platelets: 335 10*3/uL (ref 150–450)
RBC: 4.47 x10E6/uL (ref 4.14–5.80)
RDW: 12.1 % (ref 11.6–15.4)
WBC: 5.1 10*3/uL (ref 3.4–10.8)

## 2023-02-02 LAB — PSA: Prostate Specific Ag, Serum: 13.5 ng/mL — ABNORMAL HIGH (ref 0.0–4.0)

## 2023-02-02 LAB — COMPREHENSIVE METABOLIC PANEL
ALT: 16 [IU]/L (ref 0–44)
AST: 19 [IU]/L (ref 0–40)
Albumin: 4.4 g/dL (ref 3.9–4.9)
Alkaline Phosphatase: 84 [IU]/L (ref 44–121)
BUN/Creatinine Ratio: 16 (ref 10–24)
BUN: 28 mg/dL — ABNORMAL HIGH (ref 8–27)
Bilirubin Total: <0.2 mg/dL (ref 0.0–1.2)
CO2: 22 mmol/L (ref 20–29)
Calcium: 9.3 mg/dL (ref 8.6–10.2)
Chloride: 103 mmol/L (ref 96–106)
Creatinine, Ser: 1.76 mg/dL — ABNORMAL HIGH (ref 0.76–1.27)
Globulin, Total: 2.1 g/dL (ref 1.5–4.5)
Glucose: 93 mg/dL (ref 70–99)
Potassium: 4.8 mmol/L (ref 3.5–5.2)
Sodium: 137 mmol/L (ref 134–144)
Total Protein: 6.5 g/dL (ref 6.0–8.5)
eGFR: 42 mL/min/{1.73_m2} — ABNORMAL LOW (ref >59)

## 2023-02-02 LAB — MICROALBUMIN / CREATININE URINE RATIO
Creatinine, Urine: 96.2 mg/dL
Microalb/Creat Ratio: 10 mg/g{creat} (ref 0–29)
Microalbumin, Urine: 9.3 ug/mL

## 2023-02-02 LAB — TSH: TSH: 1.26 u[IU]/mL (ref 0.450–4.500)

## 2023-02-02 NOTE — Progress Notes (Signed)
Creatinine is reduced to 1.7s; this is best level for past 2 years. Continue consult with nephrology.  Elevated PSA remains; recommend consult with urology for prostate cancer workup if pt is agreeable.  Cholesterol remains elevated- total, fats, bad/LDL The 10-year ASCVD risk score (Arnett DK, et al., 2019) is: 11.1% Recommend start of statin if now inclined to use. I continue to recommend diet low in saturated fat and regular exercise - 30 min at least 5 times per week  Urine pending.

## 2023-02-02 NOTE — Progress Notes (Signed)
Stable urine micro; continue CKD follow up with new PCP and nephrology

## 2023-06-16 NOTE — Patient Instructions (Signed)

## 2023-06-18 ENCOUNTER — Ambulatory Visit (INDEPENDENT_AMBULATORY_CARE_PROVIDER_SITE_OTHER): Payer: Self-pay | Admitting: Nurse Practitioner

## 2023-06-18 ENCOUNTER — Encounter: Payer: Self-pay | Admitting: Nurse Practitioner

## 2023-06-18 VITALS — BP 97/62 | HR 96 | Temp 97.8°F | Resp 18 | Ht 70.8 in | Wt 210.0 lb

## 2023-06-18 DIAGNOSIS — Z0289 Encounter for other administrative examinations: Secondary | ICD-10-CM

## 2023-06-18 NOTE — Assessment & Plan Note (Signed)
 DOT Certificate provided x 2 years   Hearing test: Pass at 15' Vision: 20/25 R, 20/25 L, 20/25 Both Urine 1.015, Trace Protein, Trace Glucose, Neg Hematuria
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
## 2023-06-29 ENCOUNTER — Encounter: Payer: Self-pay | Admitting: Physician Assistant

## 2023-06-29 ENCOUNTER — Ambulatory Visit: Admitting: Physician Assistant

## 2023-06-29 VITALS — BP 115/70 | HR 79 | Resp 16 | Ht 72.0 in | Wt 210.0 lb

## 2023-06-29 DIAGNOSIS — M545 Low back pain, unspecified: Secondary | ICD-10-CM | POA: Diagnosis not present

## 2023-06-29 DIAGNOSIS — G8929 Other chronic pain: Secondary | ICD-10-CM

## 2023-06-29 DIAGNOSIS — R7303 Prediabetes: Secondary | ICD-10-CM

## 2023-06-29 DIAGNOSIS — E785 Hyperlipidemia, unspecified: Secondary | ICD-10-CM | POA: Diagnosis not present

## 2023-06-29 DIAGNOSIS — I10 Essential (primary) hypertension: Secondary | ICD-10-CM | POA: Diagnosis not present

## 2023-06-29 DIAGNOSIS — N1832 Chronic kidney disease, stage 3b: Secondary | ICD-10-CM | POA: Diagnosis not present

## 2023-06-29 DIAGNOSIS — K219 Gastro-esophageal reflux disease without esophagitis: Secondary | ICD-10-CM

## 2023-06-29 DIAGNOSIS — N5203 Combined arterial insufficiency and corporo-venous occlusive erectile dysfunction: Secondary | ICD-10-CM

## 2023-06-29 DIAGNOSIS — R972 Elevated prostate specific antigen [PSA]: Secondary | ICD-10-CM

## 2023-06-29 DIAGNOSIS — M25561 Pain in right knee: Secondary | ICD-10-CM

## 2023-06-29 MED ORDER — TAMSULOSIN HCL 0.4 MG PO CAPS: 0.8000 mg | ORAL_CAPSULE | Freq: Every day | ORAL | 3 refills | Status: DC

## 2023-06-29 MED ORDER — OMEPRAZOLE 20 MG PO CPDR: 20.0000 mg | DELAYED_RELEASE_CAPSULE | Freq: Two times a day (BID) | ORAL | 3 refills | Status: AC

## 2023-06-29 MED ORDER — TRAMADOL HCL 50 MG PO TABS: 100.0000 mg | ORAL_TABLET | Freq: Four times a day (QID) | ORAL | 0 refills | Status: DC

## 2023-06-29 MED ORDER — DAPAGLIFLOZIN PROPANEDIOL 10 MG PO TABS: 10.0000 mg | ORAL_TABLET | Freq: Every day | ORAL | 3 refills | Status: DC

## 2023-06-29 MED ORDER — BENAZEPRIL HCL 10 MG PO TABS: 40.0000 mg | ORAL_TABLET | Freq: Every day | ORAL | 3 refills | Status: DC

## 2023-06-29 MED ORDER — TADALAFIL 10 MG PO TABS: 10.0000 mg | ORAL_TABLET | Freq: Every day | ORAL | 3 refills | Status: DC
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
## 2023-06-30 LAB — TSH: TSH: 1.3 u[IU]/mL (ref 0.450–4.500)

## 2023-06-30 LAB — COMPREHENSIVE METABOLIC PANEL WITH GFR
ALT: 12 IU/L (ref 0–44)
AST: 17 IU/L (ref 0–40)
Albumin: 4.5 g/dL (ref 3.9–4.9)
Alkaline Phosphatase: 70 IU/L (ref 44–121)
BUN/Creatinine Ratio: 18 (ref 10–24)
BUN: 31 mg/dL — ABNORMAL HIGH (ref 8–27)
Bilirubin Total: <0.2 mg/dL (ref 0.0–1.2)
CO2: 19 mmol/L — ABNORMAL LOW (ref 20–29)
Calcium: 9.8 mg/dL (ref 8.6–10.2)
Chloride: 102 mmol/L (ref 96–106)
Creatinine, Ser: 1.75 mg/dL — ABNORMAL HIGH (ref 0.76–1.27)
Globulin, Total: 2 g/dL (ref 1.5–4.5)
Glucose: 89 mg/dL (ref 70–99)
Potassium: 4.7 mmol/L (ref 3.5–5.2)
Sodium: 137 mmol/L (ref 134–144)
Total Protein: 6.5 g/dL (ref 6.0–8.5)
eGFR: 43 mL/min/{1.73_m2} — ABNORMAL LOW (ref >59)

## 2023-06-30 LAB — CBC WITH DIFFERENTIAL/PLATELET
Basophils Absolute: 0 10*3/uL (ref 0.0–0.2)
Basos: 1 %
EOS (ABSOLUTE): 0.3 10*3/uL (ref 0.0–0.4)
Eos: 5 %
Hematocrit: 42.8 % (ref 37.5–51.0)
Hemoglobin: 14.2 g/dL (ref 13.0–17.7)
Immature Grans (Abs): 0 10*3/uL (ref 0.0–0.1)
Immature Granulocytes: 0 %
Lymphocytes Absolute: 1.4 10*3/uL (ref 0.7–3.1)
Lymphs: 23 %
MCH: 31.8 pg (ref 26.6–33.0)
MCHC: 33.2 g/dL (ref 31.5–35.7)
MCV: 96 fL (ref 79–97)
Monocytes Absolute: 0.6 10*3/uL (ref 0.1–0.9)
Monocytes: 10 %
Neutrophils Absolute: 3.7 10*3/uL (ref 1.4–7.0)
Neutrophils: 61 %
Platelets: 342 10*3/uL (ref 150–450)
RBC: 4.47 x10E6/uL (ref 4.14–5.80)
RDW: 13.2 % (ref 11.6–15.4)
WBC: 5.9 10*3/uL (ref 3.4–10.8)

## 2023-06-30 LAB — LIPID PANEL
Chol/HDL Ratio: 5.2 ratio — ABNORMAL HIGH (ref 0.0–5.0)
Cholesterol, Total: 233 mg/dL — ABNORMAL HIGH (ref 100–199)
HDL: 45 mg/dL (ref >39)
LDL Chol Calc (NIH): 162 mg/dL — ABNORMAL HIGH (ref 0–99)
Triglycerides: 145 mg/dL (ref 0–149)
VLDL Cholesterol Cal: 26 mg/dL (ref 5–40)

## 2023-06-30 LAB — HEMOGLOBIN A1C
Est. average glucose Bld gHb Est-mCnc: 111 mg/dL
Hgb A1c MFr Bld: 5.5 % (ref 4.8–5.6)

## 2023-07-03 ENCOUNTER — Ambulatory Visit: Payer: Self-pay | Admitting: Physician Assistant

## 2023-07-03 NOTE — Progress Notes (Signed)
 All labs are stable for you except -Cholesterol is elevated; 10 year risk of heart attack/stroke is 15.5% recommend diet/ exercise changes. Advised to start taking cholesterol medication  -decreased kidney function Advised to drink 8-12 glasses of water every day, avoid NSAIDs, and have renal functions check avery 6-12 months to ensure stability.

## 2023-07-06 ENCOUNTER — Encounter: Admitting: Nurse Practitioner

## 2023-09-14 ENCOUNTER — Ambulatory Visit (INDEPENDENT_AMBULATORY_CARE_PROVIDER_SITE_OTHER): Admitting: Family Medicine

## 2023-09-14 ENCOUNTER — Encounter: Payer: Self-pay | Admitting: Family Medicine

## 2023-09-14 VITALS — BP 114/82 | HR 84 | Ht 72.0 in | Wt 205.9 lb

## 2023-09-14 DIAGNOSIS — K219 Gastro-esophageal reflux disease without esophagitis: Secondary | ICD-10-CM

## 2023-09-14 DIAGNOSIS — N400 Enlarged prostate without lower urinary tract symptoms: Secondary | ICD-10-CM

## 2023-09-14 DIAGNOSIS — I10 Essential (primary) hypertension: Secondary | ICD-10-CM

## 2023-09-14 DIAGNOSIS — N5203 Combined arterial insufficiency and corporo-venous occlusive erectile dysfunction: Secondary | ICD-10-CM

## 2023-09-14 DIAGNOSIS — M25561 Pain in right knee: Secondary | ICD-10-CM | POA: Diagnosis not present

## 2023-09-14 DIAGNOSIS — R7303 Prediabetes: Secondary | ICD-10-CM

## 2023-09-14 DIAGNOSIS — R972 Elevated prostate specific antigen [PSA]: Secondary | ICD-10-CM

## 2023-09-14 DIAGNOSIS — R238 Other skin changes: Secondary | ICD-10-CM

## 2023-09-14 DIAGNOSIS — Z5320 Procedure and treatment not carried out because of patient's decision for unspecified reasons: Secondary | ICD-10-CM

## 2023-09-14 DIAGNOSIS — L578 Other skin changes due to chronic exposure to nonionizing radiation: Secondary | ICD-10-CM

## 2023-09-14 DIAGNOSIS — G8929 Other chronic pain: Secondary | ICD-10-CM

## 2023-09-14 DIAGNOSIS — E785 Hyperlipidemia, unspecified: Secondary | ICD-10-CM

## 2023-09-14 MED ORDER — TRAMADOL HCL 50 MG PO TABS: 100.0000 mg | ORAL_TABLET | Freq: Four times a day (QID) | ORAL | 0 refills | Status: DC

## 2023-09-14 MED ORDER — BENAZEPRIL HCL 10 MG PO TABS: 30.0000 mg | ORAL_TABLET | Freq: Every day | ORAL | 1 refills | Status: AC

## 2023-09-14 NOTE — Progress Notes (Signed)
 Established Patient Office Visit  Introduced to nurse practitioner role and practice setting.  All questions answered.  Discussed provider/patient relationship and expectations.   Subjective   Patient ID: Peter [name], male    DOB: [birth year]  Age: 68 y.o. MRN: [id number]  Chief Complaint  Patient presents with   Medical Management of Chronic Issues    Discussed the use of AI scribe software for clinical note transcription with the patient, who gave verbal consent to proceed.  History of Present Illness Peter [name] is a 68 year old male with hypertension and chronic knee pain who presents for medication refills.  Hypertension - Hypertension managed with benazepril , currently self-adjusted to 30 mg daily due to concern for low diastolic blood pressure readings at home - Also taking Farxiga  and tamsulosin  - No chest pain, shortness of breath, or palpitations  Chronic knee pain - Persistent bilateral knee pain since a fall in 1978 - History of four arthroscopies for meniscus repair - Pain managed with tramadol  and rapid release Tylenol  - Requests tramadol  refill  Cutaneous nodules of the neck - Bumps on the neck that become more prominent in hot weather and with sweating - Occasionally develop whiteheads, are consistently dry      09/14/2023   10:59 AM 02/01/2023    3:56 PM 03/20/2022    1:27 PM  Depression screen PHQ 2/9  Decreased Interest 0 0 0  Down, Depressed, Hopeless 0 0 0  PHQ - 2 Score 0 0 0  Altered sleeping  0 0  Tired, decreased energy  0 0  Change in appetite  0 0  Feeling bad or failure about yourself   0 0  Trouble concentrating  0 0  Moving slowly or fidgety/restless  0 0  Suicidal thoughts  0 0  PHQ-9 Score  0 0  Difficult doing work/chores  Not difficult at all Not difficult at all       09/14/2023   10:59 AM 02/01/2023    3:56 PM 01/23/2019    4:22 PM 01/17/2018    3:58 PM  GAD 7 : Generalized Anxiety Score  Nervous, Anxious, on  Edge 0 0 0 0  Control/stop worrying 0 0 2 0  Worry too much - different things 0 1 2 0  Trouble relaxing 0 0 0 0  Restless 0 0 0 0  Easily annoyed or irritable 0 0 2 0  Afraid - awful might happen 0 0 1 0  Total GAD 7 Score 0 1 7 0  Anxiety Difficulty Not difficult at all Not difficult at all Not difficult at all      ROS  Negative unless indicated in HPI   Objective:     BP 114/82 (BP Location: Right Arm, Patient Position: Sitting, Cuff Size: Normal)   Pulse 84   Ht 6' (1.829 m)   Wt 205 lb 14.4 oz (93.4 kg)   SpO2 97%   BMI 27.93 kg/m    Physical Exam Constitutional:      General: He is not in acute distress.    Appearance: Normal appearance. He is not ill-appearing, toxic-appearing or diaphoretic.  HENT:     Head: Normocephalic.     Right Ear: Tympanic membrane normal.     Left Ear: Tympanic membrane normal.     Nose: Nose normal.     Mouth/Throat:     Mouth: Mucous membranes are moist.     Dentition: Abnormal dentition. Has dentures. Dental caries present.  Eyes:  Extraocular Movements: Extraocular movements intact.     Conjunctiva/sclera: Conjunctivae normal.     Pupils: Pupils are equal, round, and reactive to light.  Cardiovascular:     Rate and Rhythm: Normal rate and regular rhythm.     Heart sounds: No murmur heard.    No friction rub. No gallop.  Pulmonary:     Effort: Pulmonary effort is normal. No respiratory distress.     Breath sounds: Normal breath sounds. No stridor. No wheezing, rhonchi or rales.  Chest:     Chest wall: No tenderness.  Musculoskeletal:     Right lower leg: No edema.     Left lower leg: No edema.  Skin:    General: Skin is warm and dry.     Capillary Refill: Capillary refill takes less than 2 seconds.     Findings: Rash present. Rash is papular.     Comments: [place] exposure to skin, dry, raised dry papules on neck, pink.   Neurological:     General: No focal deficit present.     Mental Status: He is alert and oriented  to person, place, and time. Mental status is at baseline.     Cranial Nerves: No cranial nerve deficit.     Sensory: No sensory deficit.     Motor: No weakness.     Coordination: Coordination normal.     Gait: Gait normal.  Psychiatric:        Mood and Affect: Mood normal.        Behavior: Behavior normal.        Thought Content: Thought content normal.        Judgment: Judgment normal.      No results found for any visits on 09/14/23.    The 10-year ASCVD risk score (Arnett DK, et al., 2019) is: 15.3%    Assessment & Plan:  Primary hypertension -     Benazepril  HCl; Take 3 tablets (30 mg total) by mouth daily.  Dispense: 270 tablet; Refill: 1  Chronic pain of right knee -     traMADol  HCl; Take 2 tablets (100 mg total) by mouth in the morning, at noon, in the evening, and at bedtime.  Dispense: 240 tablet; Refill: 0  Solar aging of skin -     Ambulatory referral to Dermatology     Assessment and Plan Assessment & Plan Hypertension Hypertension well-controlled with current regimen. Self-adjusted benazepril  to 30 mg daily due to low blood pressure concerns, acceptable with no adverse effects. GOAL<130/80 - continue benazepril  30 mg - recommend at home monitoring, L arm upper cuff - Limit added sodium - Denies headaches, chest pain, SOB, DOB, vision changes  Chronic right knee pain Chronic right knee pain due to previous injury and surgeries. Managed with tramadol  and Tylenol . - PMDP reviewed - okay to continue Tramdol - continue use Tylenol   Benign prostatic hyperplasia, elevated PSA  - managed with tamsulosin   Arterial insuffiency and ED - Cialis  continue  Borderline Diabetes Continue to make conscious decisions for well balanced diet smaller portions with increase protein, fruits, veggies, water as drink of choice, decrease starches, processed foods, and saturated fats. Increase weekly exercise - 150 minutes per week.  -  Farxiga  10mg .   CKD Stage  3b Chronic, stabe No NSAIDS Recheck CMMP at next visit Water drink of choice Farxiga  10mg   HLD -High cholesterol noted, declined further medication due to past adverse effects. Educated on lifestyle change, limiting fatty/sugary/processed foods -purposeful weekly exercise - Willing to discuss  Zetia at next visit, but declines starting statin  GERD Chronic, stable Continue Omeprazole  20mg  daily - Avoid laying down 2-3hours aftering eating  Pruritic papules posterior neck Pruritic papular eruption on neck, possibly folliculitis or sun exposure irritation. No prior dermatological evaluation. - Chronic sun exposure, given age recommend dermatology referral for full skin assessment - Recommend over-the-counter hydrocortisone cream for affected areas. - Recommend Aquaphor for dry skin. - Refer to dermatology for further evaluation. - Wear SPF minimally 30   Return in about 4 months (around 01/14/2024).   I, Curtis DELENA Boom, FNP, have reviewed all documentation for this visit. The documentation on 09/14/23 for the exam, diagnosis, procedures, and orders are all accurate and complete.   Curtis DELENA Boom, FNP

## 2023-09-17 ENCOUNTER — Other Ambulatory Visit: Payer: Self-pay | Admitting: Family Medicine

## 2023-09-17 ENCOUNTER — Ambulatory Visit: Payer: Self-pay

## 2023-09-17 DIAGNOSIS — S81819A Laceration without foreign body, unspecified lower leg, initial encounter: Secondary | ICD-10-CM

## 2023-09-17 MED ORDER — MUPIROCIN 2 % EX OINT: 1.0000 | TOPICAL_OINTMENT | Freq: Two times a day (BID) | CUTANEOUS | 0 refills | Status: DC

## 2023-09-17 MED ORDER — MUPIROCIN 2 % EX OINT: TOPICAL_OINTMENT | Freq: Two times a day (BID) | CUTANEOUS | Status: DC

## 2023-09-17 NOTE — Telephone Encounter (Signed)
.  FYI Only or Action Required?: - patient needs clinical advice regarding a medical issue.    Patient was last seen in primary care on 09/14/2023 by Wellington Curtis LABOR, FNP.  Called Nurse Triage reporting Advice Only.  Symptoms began today.  Triage Disposition: No disposition on file.  Patient/caregiver understands and will follow disposition?:    Copied from CRM #[id number]. Topic: Clinical - Red Word Triage >> Sep 17, 2023  7:33 AM Peter [name] wrote: Red Word that prompted transfer to Nurse Triage: Patient is calling to report that he had an injury with a chainsaw and his left knee. No major bleeding. Patient would like to know when his last tetnus shot was. Patient reporting that he refused to go to the UC/ED and sit 6-7 hoursReason for Disposition  [1] Caller requesting NON-URGENT health information AND [2] PCP's office is the best resource  Answer Assessment - Initial Assessment Questions 1. REASON FOR CALL: What is the main reason for your call? or How can I best help you?    Patient would like to know when was his last tetnaus shot was placed.  Nurse informed last noted shot on 02/20/2020.  Patient wanted to know if he is ok since 10 years has not passed or does he need to come to get a booster?  Please call pt.  Protocols used: Information Only Call - No Triage-A-AH

## 2023-09-17 NOTE — Telephone Encounter (Signed)
 Noted, Filled. Recommend if worsens needs appointment

## 2023-09-18 NOTE — Telephone Encounter (Signed)
 Spoke with pt made aware of rx being sent. Pt stated wound is starting to heal now and has been closing up but verbalized understanding of no improving needs to be seen.

## 2023-09-25 ENCOUNTER — Telehealth: Admitting: Physician Assistant

## 2023-09-25 ENCOUNTER — Ambulatory Visit: Payer: Self-pay

## 2023-09-25 NOTE — Progress Notes (Signed)
 The patient no-showed for appointment despite this provider sending direct link with no response and waiting for at least 10 minutes from appointment time for patient to join. They will be marked as a NS for this appointment/time.   Piedad Climes, PA-C

## 2023-09-25 NOTE — Telephone Encounter (Signed)
 FYI Only or Action Required?: FYI only for provider.  Patient was last seen in primary care on 09/14/2023 by Wellington Curtis LABOR, FNP.  Called Nurse Triage reporting Laceration.  Symptoms began a week ago.  Interventions attempted: Rest, hydration, or home remedies.  Symptoms are: gradually worsening.  Triage Disposition: See HCP Within 4 Hours (Or PCP Triage)  Patient/caregiver understands and will follow disposition?:   Copied from CRM #[id number]. 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 APPEARANCE of INJURY: What does the injury look like?      Healing laceration is oozing 2. ONSET: How long ago did the injury occur?      One week ago Sunday 3. LOCATION: Where is the injury located?      Left knee 4. SIZE: How large is the cut?      large 5. BLEEDING: Is it bleeding now? If Yes, ask: Is it difficult to stop?      No 6. PAIN: Is there any pain? If Yes, ask: How bad is the pain? (Scale 0-10; or none, mild, moderate, severe)     denies 7. MECHANISM: Tell me how it happened.      chainsaw 8. TETANUS: When was your last tetanus booster? 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  2) Offered available acute visit in office today but patient declines as he is currently out in the field at work, requesting virtual visit. Scheduled virtual UC today, sent activation text for MyChart, patient  attempted to log in but having difficulty, he will try again on break later, if unable to successfully sign into Mychart he will call back for assistance or alternate recommendation. Patient was unable to take down number for MyChart helpline during this call.  Protocols used: Cuts and Lacerations-A-AH

## 2023-10-15 ENCOUNTER — Other Ambulatory Visit: Payer: Self-pay | Admitting: Family Medicine

## 2023-10-15 DIAGNOSIS — G8929 Other chronic pain: Secondary | ICD-10-CM

## 2023-11-12 ENCOUNTER — Other Ambulatory Visit: Payer: Self-pay | Admitting: Family Medicine

## 2023-11-12 DIAGNOSIS — G8929 Other chronic pain: Secondary | ICD-10-CM

## 2023-11-26 ENCOUNTER — Other Ambulatory Visit: Payer: Self-pay | Admitting: Family Medicine

## 2023-11-26 DIAGNOSIS — N5203 Combined arterial insufficiency and corporo-venous occlusive erectile dysfunction: Secondary | ICD-10-CM

## 2023-11-26 NOTE — Telephone Encounter (Unsigned)
 Copied from CRM #[id number]. Topic: Clinical - Medication Refill >> Nov 26, 2023  4:07 PM Donee H wrote: Medication: tadalafil  (CIALIS ) 10 MG tablet  Has the patient contacted their pharmacy? Yes, was told to reach out to provider for refill   This is the patient's preferred pharmacy:   [facility] #[id number] [place], [place] - [address] AT [facility] [name] [place] [address] Phone: [phone] Fax: [phone]  Is this the correct pharmacy for this prescription? Yes    Has the prescription been filled recently? No  Is the patient out of the medication? No but only has 3 tablet left  Has the patient been seen for an appointment in the last year OR does the patient have an upcoming appointment? Yes  Can we respond through MyChart? No  Agent: Please be advised that Rx refills may take up to 3 business days. We ask that you follow-up with your pharmacy.

## 2023-11-28 MED ORDER — TADALAFIL 10 MG PO TABS: 10.0000 mg | ORAL_TABLET | Freq: Every day | ORAL | 1 refills | Status: AC

## 2023-11-28 NOTE — Telephone Encounter (Signed)
 Requested Prescriptions  Pending Prescriptions Disp Refills   tadalafil  (CIALIS ) 10 MG tablet 90 tablet 1    Sig: Take 1 tablet (10 mg total) by mouth daily.     Urology: Erectile Dysfunction Agents Passed - 11/28/2023  1:17 PM      Passed - AST in normal range and within 360 days    AST  Date Value Ref Range Status  06/29/2023 17 0 - 40 IU/L Final         Passed - ALT in normal range and within 360 days    ALT  Date Value Ref Range Status  06/29/2023 12 0 - 44 IU/L Final         Passed - Last BP in normal range    BP Readings from Last 1 Encounters:  09/14/23 114/82         Passed - Valid encounter within last 12 months    Recent Outpatient Visits           2 months ago Primary hypertension   [place] [facility], Curtis A, FNP   5 months ago Hyperlipidemia, unspecified hyperlipidemia type   [facility] [place], [place], PA-C   5 months ago Encounter for examination required by Department of Transportation (DOT)   [place] [facility] [place], Melanie DASEN, NP

## 2023-12-13 ENCOUNTER — Other Ambulatory Visit: Payer: Self-pay | Admitting: Family Medicine

## 2023-12-13 DIAGNOSIS — G8929 Other chronic pain: Secondary | ICD-10-CM

## 2023-12-13 NOTE — Telephone Encounter (Unsigned)
 Copied from CRM #[id number]. Topic: Clinical - Medication Question >> Dec 13, 2023  3:30 PM Delon DASEN wrote: Reason for CRM: traMADol  (ULTRAM ) 50 MG tablet - has an issue with his prescription and wants to speak with the nurse- [phone]

## 2023-12-28 ENCOUNTER — Ambulatory Visit (INDEPENDENT_AMBULATORY_CARE_PROVIDER_SITE_OTHER): Admitting: Family Medicine

## 2023-12-28 ENCOUNTER — Encounter: Payer: Self-pay | Admitting: Family Medicine

## 2023-12-28 VITALS — BP 113/70 | HR 92 | Ht 72.0 in | Wt 204.9 lb

## 2023-12-28 DIAGNOSIS — I10 Essential (primary) hypertension: Secondary | ICD-10-CM

## 2023-12-28 DIAGNOSIS — N5203 Combined arterial insufficiency and corporo-venous occlusive erectile dysfunction: Secondary | ICD-10-CM

## 2023-12-28 DIAGNOSIS — M25561 Pain in right knee: Secondary | ICD-10-CM

## 2023-12-28 DIAGNOSIS — R972 Elevated prostate specific antigen [PSA]: Secondary | ICD-10-CM

## 2023-12-28 DIAGNOSIS — E785 Hyperlipidemia, unspecified: Secondary | ICD-10-CM

## 2023-12-28 DIAGNOSIS — N1832 Chronic kidney disease, stage 3b: Secondary | ICD-10-CM

## 2023-12-28 DIAGNOSIS — Z5320 Procedure and treatment not carried out because of patient's decision for unspecified reasons: Secondary | ICD-10-CM

## 2023-12-28 DIAGNOSIS — G8929 Other chronic pain: Secondary | ICD-10-CM

## 2023-12-28 MED ORDER — TRAMADOL HCL 50 MG PO TABS: ORAL_TABLET | ORAL | 0 refills | Status: DC

## 2023-12-28 NOTE — Progress Notes (Signed)
 Established Patient Office Visit  Introduced to nurse practitioner role and practice setting.  All questions answered.  Discussed provider/patient relationship and expectations.  Subjective   Patient ID: Peter [name], male    DOB: [birth year]  Age: 68 y.o. MRN: [id number]  Chief Complaint  Patient presents with   Medication Refill   Discussed the use of AI scribe software for clinical note transcription with the patient, who gave verbal consent to proceed.  History of Present Illness Peter [name] is a 68 year old male who presents for medication management and follow-up.  He has a history of chronic pain due to career-related injuries and is currently taking tramadol  50 mg as needed for pain management. He is concerned about effectively managing his pain and is considering future medication options.  He is taking benazepril  30 mg daily for hypertension, which he self-adjusted from a previous higher dose. He reports that his blood pressure is well-controlled with this regimen.  He takes Cialis  10 mg daily for benign prostatic hyperplasia (BPH) and Flomax  0.8 mg for elevated PSA levels. He inquires about alternative treatments for BPH and maintaining sexual health, noting a strong relationship with his wife.  He intermittently takes potassium supplements to manage leg cramps, particularly during the summer months when he sweats more. He has a history of low potassium levels and leg cramping.  Mentions recent fall while working, bruised Left upper arm - improving.      09/14/2023   10:59 AM 02/01/2023    3:56 PM 03/20/2022    1:27 PM  Depression screen PHQ 2/9  Decreased Interest 0 0 0  Down, Depressed, Hopeless 0 0 0  PHQ - 2 Score 0 0 0  Altered sleeping  0 0  Tired, decreased energy  0 0  Change in appetite  0 0  Feeling bad or failure about yourself   0 0  Trouble concentrating  0 0  Moving slowly or fidgety/restless  0 0  Suicidal thoughts  0 0  PHQ-9 Score  0   0   Difficult doing work/chores  Not difficult at all Not difficult at all     Data saved with a previous flowsheet row definition       09/14/2023   10:59 AM 02/01/2023    3:56 PM 01/23/2019    4:22 PM 01/17/2018    3:58 PM  GAD 7 : Generalized Anxiety Score  Nervous, Anxious, on Edge 0 0 0 0  Control/stop worrying 0 0 2 0  Worry too much - different things 0 1 2 0  Trouble relaxing 0 0 0 0  Restless 0 0 0 0  Easily annoyed or irritable 0 0 2 0  Afraid - awful might happen 0 0 1 0  Total GAD 7 Score 0 1 7 0  Anxiety Difficulty Not difficult at all Not difficult at all Not difficult at all      ROS  Negative unless indicated in HPI   Objective:     BP 113/70 (BP Location: Left Arm, Patient Position: Sitting)   Pulse 92   Ht 6' (1.829 m)   Wt 204 lb 14.4 oz (92.9 kg)   SpO2 96%   BMI 27.79 kg/m    Physical Exam Constitutional:      General: He is not in acute distress.    Appearance: Normal appearance. He is not ill-appearing, toxic-appearing or diaphoretic.  HENT:     Head: Normocephalic.     Nose: Nose  normal.     Mouth/Throat:     Mouth: Mucous membranes are moist.  Eyes:     Extraocular Movements: Extraocular movements intact.     Conjunctiva/sclera: Conjunctivae normal.     Pupils: Pupils are equal, round, and reactive to light.  Cardiovascular:     Rate and Rhythm: Normal rate and regular rhythm.     Heart sounds: No murmur heard.    No friction rub. No gallop.  Pulmonary:     Effort: Pulmonary effort is normal. No respiratory distress.     Breath sounds: Normal breath sounds. No stridor. No wheezing, rhonchi or rales.  Chest:     Chest wall: No tenderness.  Musculoskeletal:     Right lower leg: No edema.     Left lower leg: No edema.  Skin:    General: Skin is warm and dry.     Capillary Refill: Capillary refill takes less than 2 seconds.  Neurological:     General: No focal deficit present.     Mental Status: He is alert and oriented to  person, place, and time. Mental status is at baseline.     Cranial Nerves: No cranial nerve deficit.     Sensory: No sensory deficit.     Motor: No weakness.     Coordination: Coordination normal.     Gait: Gait normal.  Psychiatric:        Mood and Affect: Mood normal.        Behavior: Behavior normal.        Thought Content: Thought content normal.        Judgment: Judgment normal.      No results found for any visits on 12/28/23.    The 10-year ASCVD risk score (Arnett DK, et al., 2019) is: 15.1%    Assessment & Plan:  Primary hypertension -     Comprehensive metabolic panel with GFR  Chronic pain of right knee -     PSA -     traMADol  HCl; TAKE 2 TABLETS BY MOUTH EVERY MORNING, AT NOON, EVERY EVENING AND EVERY NIGHT AT BEDTIME FOR MANAGEMENT OF KNEE PAIN  Dispense: 240 tablet; Refill: 0  Stage 3b chronic kidney disease (HCC) -     CBC -     Comprehensive metabolic panel with GFR -     Microalbumin / creatinine urine ratio  Hyperlipidemia, unspecified hyperlipidemia type -     Lipid panel  Elevated PSA, between 10 and less than 20 ng/ml  Colon cancer screening declined  Statin declined  Combined arterial insufficiency and corporo-venous occlusive erectile dysfunction     Assessment and Plan Assessment & Plan Chronic pain of right knee Due to career injuries, managed with tramadol  prn - chronic use - risk discussed patient aware, would like to continue - f/u two months to establish with new PCP.  Essential hypertension Chronic, well controlled - Continue benazepril  30 mg daily. - check CMP today  Chronic kidney disease stage 3b Managed with Farxiga . - Continue Farxiga  10 mg daily. - Ordered CMP and urine microalbumin. - ensure adequate water intake  Hyperlipidemia - previously declined statin with known risk of 15.1% - continue lifestyle management - Ordered lipid panel today - patient is fasting, due for labs  ED - continue cialis  -  recommend further concerns through urology referral - declines today  GERD - continue omeprazole  20mg  BID  The 10-year ASCVD risk score (Arnett DK, et al., 2019) is: 15.1%   Values used to calculate the  score:     Age: 62 years     Clincally relevant sex: Male     Is Non-Hispanic African American: No     Diabetic: No     Tobacco smoker: No     Systolic Blood Pressure: 113 mmHg     Is BP treated: Yes     HDL Cholesterol: 45 mg/dL     Total Cholesterol: 233 mg/dL  Benign prostatic hyperplasia and hx of elevated PSA - prior PSA = 13.5 - Continue Cialis  10 mg daily, also on for ED. - Continue Flomax  0.8 mg daily. - Ordered PSA test. - further to urology if PSA increased  History of hypokalemia Managed with intermittent potassium supplementation due to muscle cramping. - Ordered CMP to check potassium levels. - Continue intermittent potassium supplementation as needed.  Continues to decline colonoscopy  Return in about 2 months (around 02/27/2024) for Chronic Disease Mgmt - new pcp.   I, Curtis DELENA Boom, FNP, have reviewed all documentation for this visit. The documentation on 12/28/23 for the exam, diagnosis, procedures, and orders are all accurate and complete.   Curtis DELENA Boom, FNP

## 2023-12-29 LAB — COMPREHENSIVE METABOLIC PANEL WITH GFR
ALT: 10 IU/L (ref 0–44)
AST: 19 IU/L (ref 0–40)
Albumin: 4.7 g/dL (ref 3.9–4.9)
Alkaline Phosphatase: 71 IU/L (ref 47–123)
BUN/Creatinine Ratio: 18 (ref 10–24)
BUN: 28 mg/dL — ABNORMAL HIGH (ref 8–27)
Bilirubin Total: 0.4 mg/dL (ref 0.0–1.2)
CO2: 20 mmol/L (ref 20–29)
Calcium: 10 mg/dL (ref 8.6–10.2)
Chloride: 99 mmol/L (ref 96–106)
Creatinine, Ser: 1.53 mg/dL — ABNORMAL HIGH (ref 0.76–1.27)
Globulin, Total: 2.1 g/dL (ref 1.5–4.5)
Glucose: 101 mg/dL — ABNORMAL HIGH (ref 70–99)
Potassium: 5 mmol/L (ref 3.5–5.2)
Sodium: 135 mmol/L (ref 134–144)
Total Protein: 6.8 g/dL (ref 6.0–8.5)
eGFR: 50 mL/min/1.73 — ABNORMAL LOW (ref >59)

## 2023-12-29 LAB — CBC
Hematocrit: 44.1 % (ref 37.5–51.0)
Hemoglobin: 14.8 g/dL (ref 13.0–17.7)
MCH: 32.7 pg (ref 26.6–33.0)
MCHC: 33.6 g/dL (ref 31.5–35.7)
MCV: 98 fL — ABNORMAL HIGH (ref 79–97)
Platelets: 441 x10E3/uL (ref 150–450)
RBC: 4.52 x10E6/uL (ref 4.14–5.80)
RDW: 12.5 % (ref 11.6–15.4)
WBC: 7 x10E3/uL (ref 3.4–10.8)

## 2023-12-29 LAB — LIPID PANEL
Chol/HDL Ratio: 4.3 ratio (ref 0.0–5.0)
Cholesterol, Total: 219 mg/dL — ABNORMAL HIGH (ref 100–199)
HDL: 51 mg/dL (ref >39)
LDL Chol Calc (NIH): 138 mg/dL — ABNORMAL HIGH (ref 0–99)
Triglycerides: 167 mg/dL — ABNORMAL HIGH (ref 0–149)
VLDL Cholesterol Cal: 30 mg/dL (ref 5–40)

## 2023-12-29 LAB — PSA: Prostate Specific Ag, Serum: 16.1 ng/mL — ABNORMAL HIGH (ref 0.0–4.0)

## 2023-12-31 ENCOUNTER — Telehealth: Payer: Self-pay | Admitting: Pharmacy Technician

## 2023-12-31 ENCOUNTER — Other Ambulatory Visit (HOSPITAL_COMMUNITY): Payer: Self-pay

## 2023-12-31 ENCOUNTER — Telehealth: Payer: Self-pay

## 2023-12-31 NOTE — Telephone Encounter (Signed)
 Copied from CRM #[id number]. Topic: Clinical - Lab/Test Results >> Dec 31, 2023  9:58 AM Myrick T wrote: Reason for CRM: patient called to get his lab results form 11/21. Please f/u with patient

## 2023-12-31 NOTE — Telephone Encounter (Addendum)
 Pharmacy Patient Advocate Encounter   Received notification from Onbase/pt's pharmacy that prior authorization for Tramadol  50mg  is required/requested. Crissie, [address] [place])   [employer] verification completed.   The patient is insured through [employer].   Per test claim: Refill too soon. PA is not needed at this time. Medication was filled 12/13/23. Next eligible fill date is 01/12/24.  Called the plan to confirm, since the test claim rejection was about the quantity limit. She was able to do a future date test claim that went through for 12/6, but not 12/4 (2 days early, like we would typically allow this to be filled- he may have an accumulation on hand that is preventing an early fill this time.)  Call [employer] @ [phone] if there is an issue with the fill in the future.

## 2024-01-01 ENCOUNTER — Ambulatory Visit: Payer: Self-pay

## 2024-01-01 ENCOUNTER — Other Ambulatory Visit: Payer: Self-pay

## 2024-01-01 DIAGNOSIS — R972 Elevated prostate specific antigen [PSA]: Secondary | ICD-10-CM

## 2024-01-01 LAB — MICROALBUMIN / CREATININE URINE RATIO
Creatinine, Urine: 67.1 mg/dL
Microalb/Creat Ratio: 16 mg/g{creat} (ref 0–29)
Microalbumin, Urine: 10.7 ug/mL

## 2024-01-01 LAB — SPECIMEN STATUS REPORT

## 2024-01-01 NOTE — Telephone Encounter (Signed)
 Please call patient and let him know that I reviewed his recent lab results: - His PSA has increased up to 16. This can be due to prostate enlargement, however I recommend that he see urology for further evaluation. I sent a referral, someone should call him to schedule. - Kidney function is improving. - Cholesterol is improving, but still elevated. To lower his level, I recommend trying to optimize his diet. I recommend incorporating whole grains, vegetables, fruits, nuts, and fish into your diet and reduce intake of red meats, fried foods, and full-fat dairy products. I recommend rechecking your cholesterol levels in 1 year.

## 2024-01-02 NOTE — Telephone Encounter (Signed)
 Pt advised. Verbalized understanding.

## 2024-01-04 NOTE — Progress Notes (Signed)
 LabResults were sent by Dr. Franchot on 01/01/24

## 2024-02-04 ENCOUNTER — Telehealth: Payer: Self-pay | Admitting: Family Medicine

## 2024-02-04 DIAGNOSIS — N1832 Chronic kidney disease, stage 3b: Secondary | ICD-10-CM

## 2024-02-04 DIAGNOSIS — I10 Essential (primary) hypertension: Secondary | ICD-10-CM

## 2024-02-04 MED ORDER — DAPAGLIFLOZIN PROPANEDIOL 10 MG PO TABS: 10.0000 mg | ORAL_TABLET | Freq: Every day | ORAL | 3 refills | Status: DC

## 2024-02-04 NOTE — Telephone Encounter (Signed)
 [facility] faxed refill request for the following medications:  dapagliflozin  propanediol (FARXIGA ) 10 MG TABS tablet     Please advise.

## 2024-02-12 ENCOUNTER — Other Ambulatory Visit: Payer: Self-pay

## 2024-02-12 DIAGNOSIS — G8929 Other chronic pain: Secondary | ICD-10-CM

## 2024-02-12 NOTE — Telephone Encounter (Unsigned)
 Copied from CRM [id number]. Topic: Clinical - Medication Refill >> Feb 12, 2024 10:29 AM Roselie BROCKS wrote: Medication: traMADol  (ULTRAM ) 50 MG tablet  Patient has a new patient appnt set   Has the patient contacted their pharmacy? Yes (Agent: If no, request that the patient contact the pharmacy for the refill. If patient does not wish to contact the pharmacy document the reason why and proceed with request.) (Agent: If yes, when and what did the pharmacy advise?)  This is the patient's preferred pharmacy:   [facility] #[id number] [place], [place] - [address] AT [facility] [address] [place] [address] Phone: [phone] Fax: [phone]  Is this the correct pharmacy for this prescription? Yes If no, delete pharmacy and type the correct one.   Has the prescription been filled recently? No  Is the patient out of the medication? Yes  Has the patient been seen for an appointment in the last year OR does the patient have an upcoming appointment? Yes  Can we respond through MyChart? No  Agent: Please be advised that Rx refills may take up to 3 business days. We ask that you follow-up with your pharmacy.

## 2024-02-13 MED ORDER — TRAMADOL HCL 50 MG PO TABS: ORAL_TABLET | ORAL | 0 refills | Status: DC

## 2024-02-13 NOTE — Telephone Encounter (Signed)
 Requested medication (s) are due for refill today: yes  Requested medication (s) are on the active medication list: yes  Last refill:    Future visit scheduled: yes  Notes to clinic:  Unable to refill per protocol, cannot delegate.      Requested Prescriptions  Pending Prescriptions Disp Refills   traMADol  (ULTRAM ) 50 MG tablet 240 tablet 0    Sig: TAKE 2 TABLETS BY MOUTH EVERY MORNING, AT NOON, EVERY EVENING AND EVERY NIGHT AT BEDTIME FOR MANAGEMENT OF KNEE PAIN     Not Delegated - Analgesics:  Opioid Agonists Failed - 02/13/2024  4:27 PM      Failed - This refill cannot be delegated      Failed - Urine Drug Screen completed in last 360 days      Passed - Valid encounter within last 3 months    Recent Outpatient Visits           1 month ago Primary hypertension   [facility] [place], Curtis LABOR, FNP   5 months ago Primary hypertension   [facility] [place], Curtis A, FNP   7 months ago Hyperlipidemia, unspecified hyperlipidemia type   [facility] [place], [place], PA-C   8 months ago Encounter for examination required by Department of Transportation (DOT)   [facility] [place], Melanie DASEN, NP

## 2024-02-25 ENCOUNTER — Telehealth: Payer: Self-pay | Admitting: Family Medicine

## 2024-02-25 DIAGNOSIS — R972 Elevated prostate specific antigen [PSA]: Secondary | ICD-10-CM

## 2024-02-25 MED ORDER — TAMSULOSIN HCL 0.4 MG PO CAPS: 0.8000 mg | ORAL_CAPSULE | Freq: Every day | ORAL | 3 refills | Status: AC

## 2024-02-25 NOTE — Telephone Encounter (Signed)
[place] [facility] faxed refill request for the following medications:  tamsulosin (FLOMAX) 0.4 MG CAPS capsule   Please advise.

## 2024-02-29 ENCOUNTER — Ambulatory Visit (INDEPENDENT_AMBULATORY_CARE_PROVIDER_SITE_OTHER)

## 2024-02-29 VITALS — BP 123/81 | HR 90 | Temp 97.7°F | Wt 213.2 lb

## 2024-02-29 DIAGNOSIS — I10 Essential (primary) hypertension: Secondary | ICD-10-CM | POA: Diagnosis not present

## 2024-02-29 DIAGNOSIS — Z1211 Encounter for screening for malignant neoplasm of colon: Secondary | ICD-10-CM

## 2024-02-29 DIAGNOSIS — N1831 Chronic kidney disease, stage 3a: Secondary | ICD-10-CM | POA: Diagnosis not present

## 2024-02-29 DIAGNOSIS — M25512 Pain in left shoulder: Secondary | ICD-10-CM | POA: Diagnosis not present

## 2024-02-29 DIAGNOSIS — M545 Low back pain, unspecified: Secondary | ICD-10-CM

## 2024-02-29 DIAGNOSIS — M25561 Pain in right knee: Secondary | ICD-10-CM | POA: Diagnosis not present

## 2024-02-29 DIAGNOSIS — G8929 Other chronic pain: Secondary | ICD-10-CM | POA: Diagnosis not present

## 2024-02-29 DIAGNOSIS — R972 Elevated prostate specific antigen [PSA]: Secondary | ICD-10-CM | POA: Diagnosis not present

## 2024-02-29 MED ORDER — TRAMADOL HCL 50 MG PO TABS: 100.0000 mg | ORAL_TABLET | Freq: Four times a day (QID) | ORAL | 0 refills | Status: AC | PRN
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
## 2024-03-12 ENCOUNTER — Other Ambulatory Visit: Payer: Self-pay

## 2024-03-12 ENCOUNTER — Telehealth: Payer: Self-pay

## 2024-03-12 DIAGNOSIS — N1831 Chronic kidney disease, stage 3a: Secondary | ICD-10-CM

## 2024-03-12 MED ORDER — EMPAGLIFLOZIN 10 MG PO TABS: 10.0000 mg | ORAL_TABLET | Freq: Every day | ORAL | 3 refills | Status: AC

## 2024-03-12 NOTE — Telephone Encounter (Signed)
 Please call patient and let him know that I sent him a similar medication called Jardiance  that is covered by his insurance. It should be about $25 per month. He should stop taking his Farxiga  and start taking Jardiance  daily.

## 2024-03-12 NOTE — Telephone Encounter (Signed)
 Copied from CRM [id number]. Topic: Clinical - Medication Question >> Mar 12, 2024  9:36 AM Winona R wrote: Pt would like to speak to a nurse to talk about medications options as his insurance has changed, and medication co pay has increase to $400.

## 2024-03-12 NOTE — Telephone Encounter (Signed)
 Thank you :)

## 2024-05-30 ENCOUNTER — Ambulatory Visit
